# Patient Record
Sex: Male | Born: 1960 | Race: Black or African American | Hispanic: No | Smoking: Never smoker
Health system: Southern US, Community
[De-identification: ages and names within clinical notes are randomized; demographics above are authoritative.]

## PROBLEM LIST (undated history)

## (undated) DIAGNOSIS — I10 Essential (primary) hypertension: Secondary | ICD-10-CM

## (undated) DIAGNOSIS — E119 Type 2 diabetes mellitus without complications: Secondary | ICD-10-CM

---

## 2003-06-18 ENCOUNTER — Emergency Department (HOSPITAL_COMMUNITY): Admission: EM | Admit: 2003-06-18 | Discharge: 2003-06-18 | Payer: Self-pay | Admitting: Emergency Medicine

## 2009-03-09 ENCOUNTER — Inpatient Hospital Stay (HOSPITAL_COMMUNITY): Admission: EM | Admit: 2009-03-09 | Discharge: 2009-03-13 | Payer: Self-pay | Admitting: Cardiology

## 2009-03-09 ENCOUNTER — Encounter: Payer: Self-pay | Admitting: Emergency Medicine

## 2009-03-11 ENCOUNTER — Encounter (INDEPENDENT_AMBULATORY_CARE_PROVIDER_SITE_OTHER): Payer: Self-pay | Admitting: Cardiology

## 2010-07-01 IMAGING — CR DG CHEST 1V PORT
1 series · 1 of 1 positions shown · non-contrast
Comparison: None

CLINICAL DATA: Chest pain and shortness of breath

CHEST - 2 VIEW

[view not recorded]
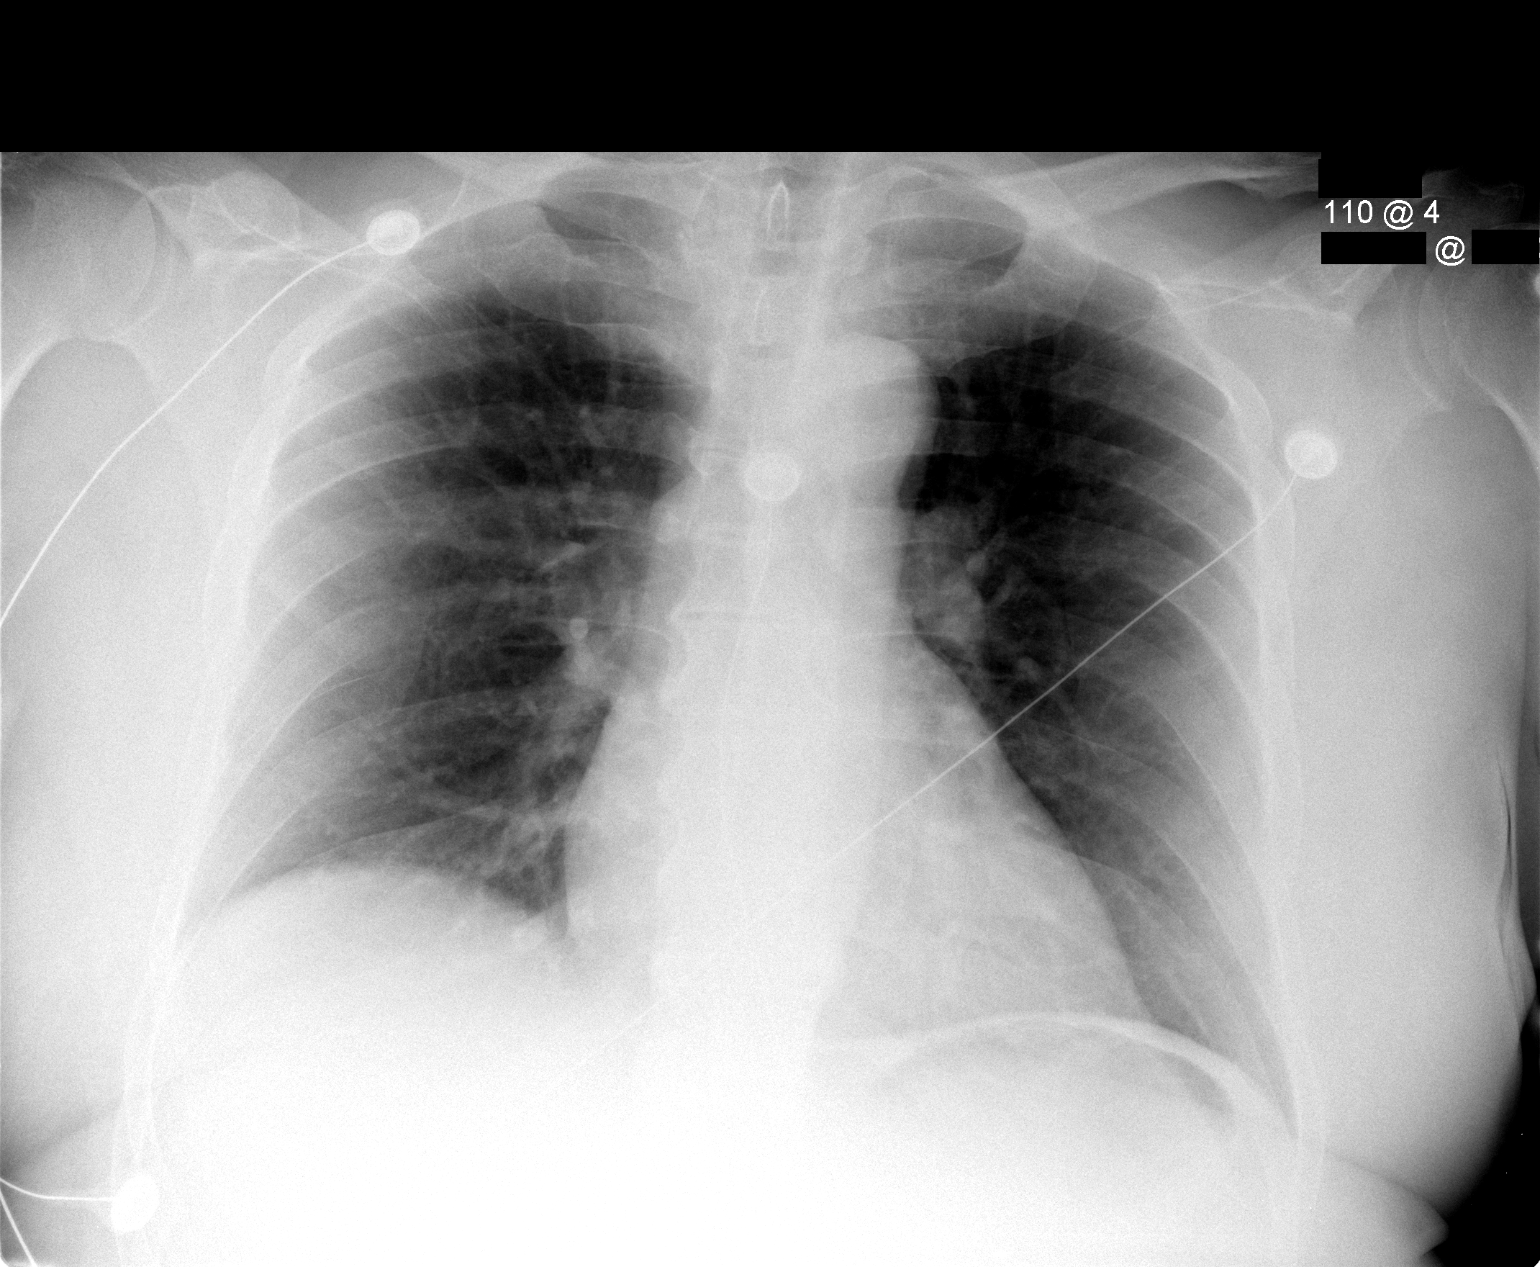

[1 of 1 positions shown; findings below may reference images not displayed]

FINDINGS: The heart size and mediastinal contours are within normal
limits.  Both lungs are clear.  The visualized skeletal structures
are unremarkable.
IMPRESSION: No active disease.

## 2010-09-04 LAB — DIFFERENTIAL
Basophils Absolute: 0.1 10*3/uL (ref 0.0–0.1)
Basophils Relative: 1 % (ref 0–1)
Eosinophils Relative: 1 % (ref 0–5)
Lymphocytes Relative: 15 % (ref 12–46)
Monocytes Absolute: 0.3 10*3/uL (ref 0.1–1.0)
Monocytes Relative: 5 % (ref 3–12)
Neutrophils Relative %: 79 % — ABNORMAL HIGH (ref 43–77)

## 2010-09-04 LAB — CBC
HCT: 42.6 % (ref 39.0–52.0)
HCT: 45.1 % (ref 39.0–52.0)
HCT: 46.4 % (ref 39.0–52.0)
HCT: 47 % (ref 39.0–52.0)
Hemoglobin: 16.2 g/dL (ref 13.0–17.0)
Hemoglobin: 13.7 g/dL (ref 13.0–17.0)
Hemoglobin: 14.5 g/dL (ref 13.0–17.0)
Hemoglobin: 14.9 g/dL (ref 13.0–17.0)
MCHC: 28.2 g/dL — ABNORMAL LOW (ref 30.0–36.0)
MCHC: 32 g/dL (ref 30.0–36.0)
MCHC: 32.3 g/dL (ref 30.0–36.0)
MCV: 77.5 fL — ABNORMAL LOW (ref 78.0–100.0)
MCV: 78.3 fL (ref 78.0–100.0)
MCV: 78.6 fL (ref 78.0–100.0)
MCV: 78.8 fL (ref 78.0–100.0)
Platelets: 262 10*3/uL (ref 150–400)
Platelets: 189 10*3/uL (ref 150–400)
Platelets: 209 10*3/uL (ref 150–400)
Platelets: 218 10*3/uL (ref 150–400)
RBC: 7.28 MIL/uL — ABNORMAL HIGH (ref 4.22–5.81)
RBC: 5.5 MIL/uL (ref 4.22–5.81)
RBC: 5.75 MIL/uL (ref 4.22–5.81)
RBC: 5.97 MIL/uL — ABNORMAL HIGH (ref 4.22–5.81)
RBC: 6.05 MIL/uL — ABNORMAL HIGH (ref 4.22–5.81)
RDW: 13.8 % (ref 11.5–15.5)
RDW: 13.8 % (ref 11.5–15.5)
RDW: 13.9 % (ref 11.5–15.5)
WBC: 7.2 10*3/uL (ref 4.0–10.5)
WBC: 10.8 10*3/uL — ABNORMAL HIGH (ref 4.0–10.5)
WBC: 7.4 10*3/uL (ref 4.0–10.5)
WBC: 7.8 10*3/uL (ref 4.0–10.5)
WBC: 8.2 10*3/uL (ref 4.0–10.5)

## 2010-09-04 LAB — HEMOGLOBIN A1C
Hgb A1c MFr Bld: 6.9 % — ABNORMAL HIGH (ref 4.6–6.1)
Mean Plasma Glucose: 151 mg/dL

## 2010-09-04 LAB — BASIC METABOLIC PANEL
BUN: 7 mg/dL (ref 6–23)
BUN: 9 mg/dL (ref 6–23)
CO2: 24 mEq/L (ref 19–32)
CO2: 27 mEq/L (ref 19–32)
Chloride: 101 mEq/L (ref 96–112)
Chloride: 101 mEq/L (ref 96–112)
Chloride: 99 mEq/L (ref 96–112)
Creatinine, Ser: 0.98 mg/dL (ref 0.4–1.5)
GFR calc non Af Amer: >60 mL/min (ref >60)
Glucose, Bld: 135 mg/dL — ABNORMAL HIGH (ref 70–99)
Glucose, Bld: 149 mg/dL — ABNORMAL HIGH (ref 70–99)
Potassium: 3.8 mEq/L (ref 3.5–5.1)
Potassium: 3.8 mEq/L (ref 3.5–5.1)
Potassium: 3.8 mEq/L (ref 3.5–5.1)
Sodium: 134 mEq/L — ABNORMAL LOW (ref 135–145)

## 2010-09-04 LAB — COMPREHENSIVE METABOLIC PANEL
ALT: 45 U/L (ref 0–53)
ALT: 61 U/L — ABNORMAL HIGH (ref 0–53)
AST: 192 U/L — ABNORMAL HIGH (ref 0–37)
Alkaline Phosphatase: 71 U/L (ref 39–117)
BUN: 5 mg/dL — ABNORMAL LOW (ref 6–23)
CO2: 30 mEq/L (ref 19–32)
Calcium: 9 mg/dL (ref 8.4–10.5)
Calcium: 9.2 mg/dL (ref 8.4–10.5)
Chloride: 96 mEq/L (ref 96–112)
GFR calc Af Amer: >60 mL/min (ref >60)
GFR calc non Af Amer: >60 mL/min (ref >60)
Glucose, Bld: 195 mg/dL — ABNORMAL HIGH (ref 70–99)
Potassium: 4.7 mEq/L (ref 3.5–5.1)
Sodium: 132 mEq/L — ABNORMAL LOW (ref 135–145)
Sodium: 135 mEq/L (ref 135–145)
Total Bilirubin: 0.3 mg/dL (ref 0.3–1.2)
Total Protein: 7.4 g/dL (ref 6.0–8.3)

## 2010-09-04 LAB — CARDIAC PANEL(CRET KIN+CKTOT+MB+TROPI)
CK, MB: 296.6 ng/mL — ABNORMAL HIGH (ref 0.3–4.0)
CK, MB: 297.2 ng/mL — ABNORMAL HIGH (ref 0.3–4.0)
Relative Index: 12.1 — ABNORMAL HIGH (ref 0.0–2.5)
Relative Index: 8.4 — ABNORMAL HIGH (ref 0.0–2.5)
Relative Index: 8.8 — ABNORMAL HIGH (ref 0.0–2.5)
Total CK: 2457 U/L — ABNORMAL HIGH (ref 7–232)
Total CK: 3376 U/L — ABNORMAL HIGH (ref 7–232)

## 2010-09-04 LAB — POCT CARDIAC MARKERS
CKMB, poc: 19.6 ng/mL (ref 1.0–8.0)
Troponin i, poc: 0.19 ng/mL — ABNORMAL HIGH (ref 0.00–0.09)

## 2010-09-04 LAB — BRAIN NATRIURETIC PEPTIDE: Pro B Natriuretic peptide (BNP): 100 pg/mL (ref 0.0–100.0)

## 2010-09-04 LAB — HEPARIN LEVEL (UNFRACTIONATED): Heparin Unfractionated: 1.39 IU/mL — ABNORMAL HIGH (ref 0.30–0.70)

## 2010-09-04 LAB — POCT I-STAT, CHEM 8
BUN: 17 mg/dL (ref 6–23)
Sodium: 137 mEq/L (ref 135–145)
TCO2: 31 mmol/L (ref 0–100)

## 2010-09-04 LAB — LIPID PANEL
HDL: 57 mg/dL (ref >39)
Total CHOL/HDL Ratio: 4.2 RATIO
Triglycerides: 29 mg/dL (ref <150)

## 2022-02-22 ENCOUNTER — Observation Stay (HOSPITAL_COMMUNITY)
Admission: EM | Admit: 2022-02-22 | Discharge: 2022-02-24 | Disposition: A | Discharge status: Home / Self Care | Payer: Commercial Managed Care - HMO | Attending: Family Medicine | Admitting: Family Medicine

## 2022-02-22 ENCOUNTER — Other Ambulatory Visit: Payer: Self-pay

## 2022-02-22 ENCOUNTER — Encounter (HOSPITAL_COMMUNITY): Payer: Self-pay | Admitting: Emergency Medicine

## 2022-02-22 ENCOUNTER — Emergency Department (HOSPITAL_COMMUNITY): Payer: Commercial Managed Care - HMO

## 2022-02-22 ENCOUNTER — Observation Stay (HOSPITAL_COMMUNITY): Payer: Commercial Managed Care - HMO

## 2022-02-22 DIAGNOSIS — R7309 Other abnormal glucose: Secondary | ICD-10-CM

## 2022-02-22 DIAGNOSIS — Z7982 Long term (current) use of aspirin: Secondary | ICD-10-CM | POA: Diagnosis not present

## 2022-02-22 DIAGNOSIS — E119 Type 2 diabetes mellitus without complications: Secondary | ICD-10-CM

## 2022-02-22 DIAGNOSIS — I10 Essential (primary) hypertension: Secondary | ICD-10-CM

## 2022-02-22 DIAGNOSIS — Z79899 Other long term (current) drug therapy: Secondary | ICD-10-CM | POA: Diagnosis not present

## 2022-02-22 DIAGNOSIS — I639 Cerebral infarction, unspecified: Principal | ICD-10-CM | POA: Diagnosis present

## 2022-02-22 DIAGNOSIS — Z789 Other specified health status: Secondary | ICD-10-CM

## 2022-02-22 DIAGNOSIS — F1090 Alcohol use, unspecified, uncomplicated: Secondary | ICD-10-CM

## 2022-02-22 DIAGNOSIS — Z7689 Persons encountering health services in other specified circumstances: Secondary | ICD-10-CM

## 2022-02-22 DIAGNOSIS — D649 Anemia, unspecified: Secondary | ICD-10-CM | POA: Insufficient documentation

## 2022-02-22 DIAGNOSIS — E876 Hypokalemia: Secondary | ICD-10-CM

## 2022-02-22 DIAGNOSIS — R29818 Other symptoms and signs involving the nervous system: Secondary | ICD-10-CM | POA: Diagnosis present

## 2022-02-22 HISTORY — DX: Essential (primary) hypertension: I10

## 2022-02-22 HISTORY — DX: Type 2 diabetes mellitus without complications: E11.9

## 2022-02-22 LAB — DIFFERENTIAL
Abs Immature Granulocytes: 0.01 10*3/uL (ref 0.00–0.07)
Basophils Absolute: 0 10*3/uL (ref 0.0–0.1)
Basophils Relative: 0 %
Eosinophils Absolute: 0 10*3/uL (ref 0.0–0.5)
Eosinophils Relative: 0 %
Immature Granulocytes: 0 %
Lymphocytes Relative: 26 %
Lymphs Abs: 1.3 10*3/uL (ref 0.7–4.0)
Monocytes Absolute: 0.4 10*3/uL (ref 0.1–1.0)
Monocytes Relative: 8 %
Neutro Abs: 3.1 10*3/uL (ref 1.7–7.7)
Neutrophils Relative %: 66 %

## 2022-02-22 LAB — CBC
HCT: 50.6 % (ref 39.0–52.0)
Hemoglobin: 15.8 g/dL (ref 13.0–17.0)
MCH: 23.7 pg — ABNORMAL LOW (ref 26.0–34.0)
MCHC: 31.2 g/dL (ref 30.0–36.0)
MCV: 76 fL — ABNORMAL LOW (ref 80.0–100.0)
Platelets: 196 10*3/uL (ref 150–400)
RBC: 6.66 MIL/uL — ABNORMAL HIGH (ref 4.22–5.81)
RDW: 14.6 % (ref 11.5–15.5)
WBC: 4.8 10*3/uL (ref 4.0–10.5)
nRBC: 0 % (ref 0.0–0.2)

## 2022-02-22 LAB — COMPREHENSIVE METABOLIC PANEL
ALT: 49 U/L — ABNORMAL HIGH (ref 0–44)
AST: 39 U/L (ref 15–41)
Albumin: 3.9 g/dL (ref 3.5–5.0)
Alkaline Phosphatase: 71 U/L (ref 38–126)
Anion gap: 11 (ref 5–15)
BUN: 14 mg/dL (ref 6–20)
CO2: 25 mmol/L (ref 22–32)
Calcium: 9.5 mg/dL (ref 8.9–10.3)
Chloride: 100 mmol/L (ref 98–111)
Creatinine, Ser: 1.22 mg/dL (ref 0.61–1.24)
GFR, Estimated: >60 mL/min (ref >60)
Glucose, Bld: 199 mg/dL — ABNORMAL HIGH (ref 70–99)
Potassium: 3.6 mmol/L (ref 3.5–5.1)
Sodium: 136 mmol/L (ref 135–145)
Total Bilirubin: 0.7 mg/dL (ref 0.3–1.2)
Total Protein: 7.2 g/dL (ref 6.5–8.1)

## 2022-02-22 LAB — I-STAT CHEM 8, ED
BUN: 18 mg/dL (ref 6–20)
Calcium, Ion: 1.21 mmol/L (ref 1.15–1.40)
Chloride: 98 mmol/L (ref 98–111)
Creatinine, Ser: 1.2 mg/dL (ref 0.61–1.24)
Glucose, Bld: 194 mg/dL — ABNORMAL HIGH (ref 70–99)
HCT: 53 % — ABNORMAL HIGH (ref 39.0–52.0)
Hemoglobin: 18 g/dL — ABNORMAL HIGH (ref 13.0–17.0)
Potassium: 3.7 mmol/L (ref 3.5–5.1)
Sodium: 137 mmol/L (ref 135–145)
TCO2: 29 mmol/L (ref 22–32)

## 2022-02-22 LAB — GLUCOSE, CAPILLARY: Glucose-Capillary: 150 mg/dL — ABNORMAL HIGH (ref 70–99)

## 2022-02-22 LAB — RAPID URINE DRUG SCREEN, HOSP PERFORMED
Amphetamines: NOT DETECTED
Barbiturates: NOT DETECTED
Benzodiazepines: NOT DETECTED
Cocaine: NOT DETECTED
Opiates: NOT DETECTED
Tetrahydrocannabinol: POSITIVE — AB

## 2022-02-22 LAB — PROTIME-INR
INR: 1.1 (ref 0.8–1.2)
Prothrombin Time: 13.6 seconds (ref 11.4–15.2)

## 2022-02-22 LAB — APTT: aPTT: 26 seconds (ref 24–36)

## 2022-02-22 LAB — HEMOGLOBIN A1C
Hgb A1c MFr Bld: 8.3 % — ABNORMAL HIGH (ref 4.8–5.6)
Mean Plasma Glucose: 191.51 mg/dL

## 2022-02-22 LAB — ETHANOL: Alcohol, Ethyl (B): <10 mg/dL (ref <10)

## 2022-02-22 MED ORDER — THIAMINE MONONITRATE 100 MG PO TABS
100.0000 mg | ORAL_TABLET | Freq: Every day | ORAL | Status: DC
  Administered 2022-02-22 – 2022-02-24 (×3): 100 mg via ORAL
  Filled 2022-02-22 (×3): qty 1

## 2022-02-22 MED ORDER — ATORVASTATIN CALCIUM 40 MG PO TABS
40.0000 mg | ORAL_TABLET | Freq: Once | ORAL | Status: AC
  Administered 2022-02-22: 40 mg via ORAL
  Filled 2022-02-22: qty 1

## 2022-02-22 MED ORDER — FOLIC ACID 1 MG PO TABS
1.0000 mg | ORAL_TABLET | Freq: Every day | ORAL | Status: DC
  Administered 2022-02-22 – 2022-02-24 (×3): 1 mg via ORAL
  Filled 2022-02-22 (×3): qty 1

## 2022-02-22 MED ORDER — ENOXAPARIN SODIUM 40 MG/0.4ML IJ SOSY
40.0000 mg | PREFILLED_SYRINGE | INTRAMUSCULAR | Status: DC
  Administered 2022-02-23 – 2022-02-24 (×2): 40 mg via SUBCUTANEOUS
  Filled 2022-02-22 (×2): qty 0.4

## 2022-02-22 MED ORDER — STROKE: EARLY STAGES OF RECOVERY BOOK
Freq: Once | Status: AC
  Filled 2022-02-22: qty 1

## 2022-02-22 MED ORDER — ASPIRIN 81 MG PO CHEW
324.0000 mg | CHEWABLE_TABLET | Freq: Once | ORAL | Status: AC
  Administered 2022-02-22: 324 mg via ORAL
  Filled 2022-02-22: qty 4

## 2022-02-22 MED ORDER — SODIUM CHLORIDE 0.9% FLUSH
3.0000 mL | Freq: Once | INTRAVENOUS | Status: AC
  Administered 2022-02-22: 3 mL via INTRAVENOUS

## 2022-02-22 MED ORDER — ATORVASTATIN CALCIUM 40 MG PO TABS
40.0000 mg | ORAL_TABLET | Freq: Every day | ORAL | Status: DC
  Administered 2022-02-23 – 2022-02-24 (×2): 40 mg via ORAL
  Filled 2022-02-22 (×2): qty 1

## 2022-02-22 MED ORDER — INSULIN ASPART 100 UNIT/ML IJ SOLN
0.0000 [IU] | Freq: Three times a day (TID) | INTRAMUSCULAR | Status: DC
  Administered 2022-02-23 (×3): 1 [IU] via SUBCUTANEOUS
  Administered 2022-02-24: 3 [IU] via SUBCUTANEOUS

## 2022-02-22 MED ORDER — IOHEXOL 350 MG/ML SOLN
75.0000 mL | Freq: Once | INTRAVENOUS | Status: AC | PRN
  Administered 2022-02-22: 75 mL via INTRAVENOUS

## 2022-02-22 NOTE — ED Provider Notes (Signed)
[facility] EMERGENCY DEPARTMENT Provider Note   CSN: [id number] Arrival date & time: 02/22/22  1501     History  Chief Complaint  Patient presents with   Stroke symptoms x 3 days    Paul Dickerson is a 61 y.o. male.  With no documented past medical history who presents to the emergency department with strokelike symptoms.  Patient states that he feels like symptoms may have began around Friday.  States that on his way to work Friday he began feeling "awkward."  He has much difficulty describing what this feels like to him.  He states that symptoms recurred on Saturday morning and then went away.  He states that then Saturday night at some time prior to dinner he began having "minor slurred speech and drooling."  He is unable to tell me what time this is.  Wife at bedside states that he made her dinner at around 10 PM after she got home from work but he tells me that symptoms began before this.  Wife states she woke up this morning and went to work and did not speak with him.  He recalls waking up with ongoing slurred speech and drooling at that time.  Wife then states that she came home from work early today and noticed that he had slurred speech and his face looked abnormal.  Patient states that he has a history of hypertension and has not taken medications in over a year.  He also does not regularly see a doctor.  He denies having any changes to his vision, numbness or tingling to the face, arms or legs or headache.  HPI     Home Medications Prior to Admission medications   Not on File      Allergies    Patient has no known allergies.    Review of Systems   Review of Systems  HENT:  Positive for drooling.   Neurological:  Positive for speech difficulty.    Physical Exam Updated Vital Signs BP (!) 222/134   Pulse 92   Temp 98.5 F (36.9 C) (Oral)   Resp 18   Ht 5\' 11"  (1.803 m)   Wt 99.8 kg   SpO2 99%   BMI 30.68 kg/m  Physical Exam Vitals and  nursing note reviewed.  Constitutional:      General: He is in acute distress.     Appearance: Normal appearance. He is not ill-appearing or toxic-appearing.  HENT:     Head: Normocephalic.     Nose: Nose normal.     Mouth/Throat:     Mouth: Mucous membranes are moist.     Pharynx: Oropharynx is clear.  Eyes:     General: No visual field deficit or scleral icterus.    Extraocular Movements: Extraocular movements intact.     Pupils: Pupils are equal, round, and reactive to light.  Cardiovascular:     Rate and Rhythm: Normal rate and regular rhythm.     Pulses: Normal pulses.  Pulmonary:     Effort: Pulmonary effort is normal. No respiratory distress.     Breath sounds: Normal breath sounds.  Abdominal:     General: Bowel sounds are normal. There is no distension.     Palpations: Abdomen is soft.     Tenderness: There is no abdominal tenderness.  Musculoskeletal:        General: Normal range of motion.     Cervical back: Neck supple.  Skin:    General: Skin is warm  and dry.     Capillary Refill: Capillary refill takes less than 2 seconds.  Neurological:     Mental Status: He is alert and oriented to person, place, and time.     Cranial Nerves: Cranial nerve deficit, dysarthria and facial asymmetry present.     Sensory: Sensation is intact.     Motor: Motor function is intact. No pronator drift.     Coordination: Coordination is intact. Finger-Nose-Finger Test and Heel to [facility] Test normal.  Psychiatric:        Mood and Affect: Mood normal.        Behavior: Behavior normal.        Judgment: Judgment normal.    ED Results / Procedures / Treatments   Labs (all labs ordered are listed, but only abnormal results are displayed) Labs Reviewed  CBC - Abnormal; Notable for the following components:      Result Value   RBC 6.66 (*)    MCV 76.0 (*)    MCH 23.7 (*)    All other components within normal limits  COMPREHENSIVE METABOLIC PANEL - Abnormal; Notable for the following  components:   Glucose, Bld 199 (*)    ALT 49 (*)    All other components within normal limits  HEMOGLOBIN A1C - Abnormal; Notable for the following components:   Hgb A1c MFr Bld 8.3 (*)    All other components within normal limits  PROTIME-INR  APTT  DIFFERENTIAL  ETHANOL  RAPID URINE DRUG SCREEN, HOSP PERFORMED  LIPID PANEL  CBC  BASIC METABOLIC PANEL  I-STAT CHEM 8, ED  CBG MONITORING, ED   EKG EKG Interpretation  Date/Time:  Sunday February 22 2022 15:20:36 EDT Ventricular Rate:  96 PR Interval:  152 QRS Duration: 150 QT Interval:  402 QTC Calculation: 507 R Axis:   -28 Text Interpretation: Normal sinus rhythm Right atrial enlargement Right bundle branch block Abnormal ECG rbbb pattern new since 2010 Confirmed by Melene Plan [id number]) on 02/22/2022 4:36:57 PM  Radiology CT HEAD WO CONTRAST  Result Date: 02/22/2022 CLINICAL DATA:  61 year old male with RIGHT facial numbness. EXAM: CT HEAD WITHOUT CONTRAST TECHNIQUE: Contiguous axial images were obtained from the base of the skull through the vertex without intravenous contrast. RADIATION DOSE REDUCTION: This exam was performed according to the departmental dose-optimization program which includes automated exposure control, adjustment of the mA and/or kV according to patient size and/or use of iterative reconstruction technique. COMPARISON:  None Available. FINDINGS: Brain: Age-indeterminate infarct versus chronic ischemic changes in the LEFT periventricular white matter noted. Atrophy, remote RIGHT basal ganglia infarct and chronic small-vessel white matter ischemic changes are noted. There is no evidence of hemorrhage, hydrocephalus, midline shift, mass lesion/mass effect or extra-axial collection. Vascular: Carotid atherosclerotic calcifications are noted. Skull: Normal. Negative for fracture or focal lesion. Sinuses/Orbits: No acute finding. The mastoid air cells, middle ears and inner ears are clear. Other: None. IMPRESSION: 1.  Age-indeterminate infarct versus chronic ischemic changes in the LEFT periventricular white matter. 2. Atrophy, remote RIGHT basal ganglia infarct and chronic small-vessel white matter ischemic changes. 3. No evidence of hemorrhage, midline shift or extra-axial collection. Electronically Signed   By: Harmon Pier M.D.   On: 02/22/2022 17:06    Procedures Procedures   Medications Ordered in ED Medications  aspirin chewable tablet 324 mg (has no administration in time range)  atorvastatin (LIPITOR) tablet 40 mg (has no administration in time range)   stroke: early stages of recovery book (has no administration in time  range)  thiamine (VITAMIN B1) tablet 100 mg (has no administration in time range)  folic acid (FOLVITE) tablet 1 mg (has no administration in time range)  enoxaparin (LOVENOX) injection 40 mg (has no administration in time range)  sodium chloride flush (NS) 0.9 % injection 3 mL (3 mLs Intravenous Given 02/22/22 1626)   ED Course/ Medical Decision Making/ A&P Clinical Course as of 02/22/22 1737  Sun Feb 22, 2022  1629 Consulted and spoke with Dr. Otelia Limes, neurology who will see the patient  [LA]  1709 CT HEAD WO CONTRAST [LA]    Clinical Course User Index [LA] Cristopher Peru, PA-C                           Medical Decision Making Amount and/or Complexity of Data Reviewed Labs: ordered. Radiology: ordered. Decision-making details documented in ED Course.  Risk Decision regarding hospitalization.  This patient presents to the ED with chief complaint(s) of facial droop with pertinent past medical history of hypertension which further complicates the presenting complaint. The complaint involves an extensive differential diagnosis and treatment options and also carries with it a high risk of complications and morbidity.    The differential diagnosis includes stroke, TIA, Bell's palsy, Todd's paralysis, etc.  Additional history obtained: Additional history obtained from  spouse Records reviewed Care Everywhere/External Records and Primary Care Documents  ED Course: Lab Tests: I Ordered, and personally interpreted labs.  The pertinent results include: Glucose 199, no AKI, electrolyte derangement, transaminitis, leukocytosis.  Coags within normal limits.  UDS, lipid panel pending Imaging Studies: I ordered and independently visualized and interpreted the following imaging CT scan head   which showed  infarct in the left periventricular white matter The interpretation of the imaging was limited to assessing for emergent pathology, for which purpose it was ordered. Cardiac Monitoring: The patient was maintained on a cardiac monitor.  I personally viewed and interpreted the cardiac monitor which showed an underlying rhythm of:  sinus rhythm Medicines ordered and prescription drug management: I ordered the following medications aspirin, Lipitor for stroke I considered this additional medications: Antihypertensives Reevaluation of the patient after these medicines showed that the patient    stayed the same  Reassessment and review: 61 year old male who presents to the emergency department with strokelike symptoms.  On physical exam he has dysarthria, right-sided facial droop, right-sided tongue deviation.  The remainder of his neurological exam with no acute findings.  His initial exam is worrisome for stroke.  He is outside of acute stroke window for tPA administration.  LVO negative.  Ordered stroke work-up including a CT of his head.  Prior to getting CT of his head and labs back I consulted and spoke with Dr. Otelia Limes, neurology who states that he will evaluate the patient.  Labs are returning with no acute findings.  His CT of his head does show a left periventricular white matter infarct.  I called and touch base with neurology and spoke with Gevena Mart, NP who requests ASA 324 and Atorvastatin 40mg . She also states to allow his hypertension for now. She will  evaluate him and requests hospital admission.  Consulted and spoke with Dr. , family medicine who agrees to admit the patient for ongoing work up. Patient made aware and is in agreement with plan.   Consultations Obtained: I requested consultation with the admitting physician Dr. Manson Passey and consultant Dr. Manson Passey and  Otelia Limes, NPwith neurology , and discussed  findings  as well as pertinent plan - they recommend: Stroke work-up, admission for stroke work-up.  Neurology requesting 324 of aspirin and 40 mg of atorvastatin which was ordered.  They placed orders for further imaging.  Dr. Owens Shark, family medicine agrees to admit the patient.  Complexity of problems addressed: Patient's presentation is most consistent with  acute presentation with potential threat to life or bodily function During patient's assessment  Disposition: After consideration of the diagnostic results and the patient's response to treatment,  I feel that the patent would benefit from admission hospital medicine .  Social Determinants of Health: Patient's impaired access to primary care  increases the complexity of managing their presentation  Final Clinical Impression(s) / ED Diagnoses Final diagnoses:  Cerebrovascular accident (CVA), unspecified mechanism [facility])    Rx / DC Orders ED Discharge Orders     None         Mickie Hillier, PA-C 02/22/22 Manson, DO 02/22/22 1905

## 2022-02-22 NOTE — ED Triage Notes (Signed)
Patient here w/ slurred speech, R sided facial droop that started at 10 p.m. yesterday. Patient states that he started feeling funny on Friday and when asked to decribe he states " I was just feeling unwell" couldn't elaborate further. Patient is noncompliant with BP medications. Aox4.

## 2022-02-22 NOTE — Assessment & Plan Note (Addendum)
TOC needs for PCP. Also needs preventative healthcare.

## 2022-02-22 NOTE — ED Notes (Signed)
Patient transported to CT 

## 2022-02-22 NOTE — Assessment & Plan Note (Addendum)
MRI with acute lacunar infarct of L corona radiata and posterior L lentiform. 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    - neurology following, appreciate assistance - start losartan 50mg  daily for uncontrolled HTN - Aspirin 81mg  daily and plavix for 3 weeks then ASA 81 monotherapy - atorvastatin 40mg  daily  -SLP evaluated, stated she could go home

## 2022-02-22 NOTE — Assessment & Plan Note (Addendum)
8.6 here, CBG q4 AC & QHS. Started on very sensitive SSI.  -plan on GLP1 outpatient

## 2022-02-22 NOTE — Hospital Course (Signed)
LIBAN GUEDES is a 61 y.o.male with a history of HTN and T2DM (untreated) who was admitted to the Teaching Service at [facility] for dysarthria and R sided facial droop. His hospital course is detailed below:  Acute cerebrovascular accident  Resulting in dysarthria and a right sided facial weakness, in setting of vertebral and intracranial stenosis. Neurology was consulted. MRI showed acute infarct in left corona radiata and posterior left lentiform nucleus as well as multiple old infarcts. 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 Moderate narrowing of right P2 and asymmetric attenuation of right MCA branch vessels. Echo with EF 45-50%. Permissive HTN was allowed for 24-48 hrs. He was started on ASA 325 and atorvastatin 40mg  for LDL 162. He was started on losartan for uncontrolled HTN. He was discharged on ASA 81 and plavix for 3 weeks then ASA monotherapy.   Type 2 DM Hgb A1c 8.3 on admission. Plan to start GLP-1 agonist outpatient. Metformin started at time of discharge.  HTN Started on losartan 50mg  given hypokalemia that required repletionduring hospitalization.  Alcohol use 15 beers in 2 days, CIWAs implemented and monitored throughout hospitalization, 0 throughout. No history of DTs or alcohol withdrawal.  Other chronic conditions were medically managed with home medications and formulary alternatives as necessary  PCP Follow-up Recommendations: [ ]  start GLP-1 for diabetes, Trulicity preferred [ ]  f/u BP  [ ]  f/u cardiology recommendations, scheduled outpatient visit

## 2022-02-22 NOTE — Assessment & Plan Note (Addendum)
Known history of HTN with med noncompliance  - Permissive HTN  - starting losartan today

## 2022-02-22 NOTE — Progress Notes (Signed)
FMTS Interim Progress Note  S:Went to bedside to check on patient alongside Dr. Sabra Heck. Patient's girlfriend, daughter and son. Denies any concerns at this time. Briefly discussed ongoing workup, all questions answered. They inquired about when he would get a bed to which I explained that although the order has been placed it could take some time but hopefully soon.   O: BP (!) 166/99   Pulse 80   Temp 98.4 F (36.9 C) (Oral)   Resp 18   Ht 5\' 11"  (1.803 m)   Wt 99.8 kg   SpO2 98%   BMI 30.68 kg/m   General: Patient sitting upright in bed, in no acute distress. Resp: normal work of breathing noted on room air  Neuro: AOx4 Psych: mood appropriate, pleasant   A/P: Patient admitted for concern of stroke after experiencing slurred speech yesterday. Neurology following, appreciate recommendations. Risk stratification labs pending along with ongoing stroke workup. Will monitor CIWAs given history of alcohol use. Hold antihypertensive treatment in the setting of likely permissive hypertension with potential stroke. Vitals stable. Telemetry and orders reviewed and appropriate. Remainder of plan per day team.   Donney Dice, DO 02/22/2022, 8:49 PM PGY-3, [facility] pager [phone]

## 2022-02-22 NOTE — Consult Note (Addendum)
Neurology Consultation  Reason for Consult: Slurred speech and right facial droop   Referring Physician: Dr. Tyrone Nine  CC: Slurred speech and facial droop  History is obtained from:patient  HPI: Paul Dickerson is a 61 y.o. male with past medical history of HTN, HLD and ETOH abuse who presents to [facility] ED for evaluation of slurred speech and facial droop. Per patient on Friday he felt "off", "not right" but denies facial droop, slurred speech or weakness. Girlfriend who is at the bedside said she did not see him but spoke to him yesterday and he sounded fine. He states Saturday he was fine and no symptoms, girlfriend states she did not see him yesterday.  He says he went to bed at 0100 and was in his normal state of health. He states that when he woke this morning he noticed slurred speech. His girlfriend states she came home from work @ 1300 and noticed his slurred speech and facial droop and brought him to the hospital. He denies any headache, vision problems, weakness, numbness or tingling  He is hypertensive 200's/100's.  He does not go to the doctor nor does he take any medication; he was supposed to be taking bp meds and cholesterol meds but has stopped those.  Her drinks ~7 beers/day, denies smoking cigarettes, endorses marijuana use, no other illicit drugs.    LKW: 0100 9/24 IV thrombolysis given?: no, outside window  Premorbid modified Rankin scale (mRS):  0-Completely asymptomatic and back to baseline post-stroke  ROS: Full ROS was performed and is negative except as noted in the HPI.    PMHx As per HPI   History reviewed. No pertinent family history.   Social History:   has no history on file for tobacco use, alcohol use, and drug use.  Medications No current facility-administered medications for this encounter. No current outpatient medications on file.   Exam: Current vital signs: BP (!) 206/131   Pulse 89   Temp 98.5 F (36.9 C) (Oral)   Resp 18   Ht '5\' 11"'   (1.803 m)   Wt 99.8 kg   SpO2 100%   BMI 30.68 kg/m  Vital signs in last 24 hours: Temp:  [98.5 F (36.9 C)-99.3 F (37.4 C)] 98.5 F (36.9 C) (09/24 1600) Pulse Rate:  [89-96] 89 (09/24 1630) Resp:  [16-18] 18 (09/24 1630) BP: (197-229)/(100-131) 206/131 (09/24 1630) SpO2:  [96 %-100 %] 100 % (09/24 1630) Weight:  [99.8 kg] 99.8 kg (09/24 1600)  GENERAL: Awake, alert in NAD HEENT: - Normocephalic and atraumatic, dry mm, missing teeth  LUNGS - Clear to auscultation bilaterally with no wheezes CV - S1S2 RRR, no m/r/g, equal pulses bilaterally. ABDOMEN - Soft, nontender, nondistended with normoactive BS Ext: warm, well perfused, intact peripheral pulses, no edema  NEURO:  Mental Status: AA&Ox3  Language: speech is dysarthric.  Naming, repetition, fluency, and comprehension intact. Cranial Nerves: PERRL EOMI, visual fields full, right facial asymmetry, facial sensation intact, hearing intact, tongue/uvula/soft palate midline, normal sternocleidomastoid and trapezius muscle strength. No evidence of tongue atrophy or fasciculations Motor: 5/5 in all 4 extremities Tone: is normal and bulk is normal Sensation- Intact to light touch bilaterally Coordination: FTN intact bilaterally, no ataxia in BLE. Gait- deferred  NIHSS 1a Level of Conscious.: 0 1b LOC Questions: 0 1c LOC Commands: 0 2 Best Gaze: 0 3 Visual: 0 4 Facial Palsy: 2 5a Motor Arm - left: 0 5b Motor Arm - Right: 0 6a Motor Leg - Left: 0 6b Motor Leg -  Right: 0 7 Limb Ataxia: 0 8 Sensory: 0 9 Best Language: 0 10 Dysarthria: 1 11 Extinct. and Inatten.: 0 TOTAL: 3   Labs I have reviewed labs in epic and the results pertinent to this consultation are:  CBC    Component Value Date/Time   WBC 4.8 02/22/2022 1624   RBC 6.66 (H) 02/22/2022 1624   HGB 15.8 02/22/2022 1624   HCT 50.6 02/22/2022 1624   PLT 196 02/22/2022 1624   MCV 76.0 (L) 02/22/2022 1624   MCH 23.7 (L) 02/22/2022 1624   MCHC 31.2 02/22/2022  1624   RDW 14.6 02/22/2022 1624   LYMPHSABS 1.3 02/22/2022 1624   MONOABS 0.4 02/22/2022 1624   EOSABS 0.0 02/22/2022 1624   BASOSABS 0.0 02/22/2022 1624    CMP     Component Value Date/Time   NA 134 (L) 03/13/2009 0415   K 3.8 03/13/2009 0415   CL 101 03/13/2009 0415   CO2 26 03/13/2009 0415   GLUCOSE 135 (H) 03/13/2009 0415   BUN 10 03/13/2009 0415   CREATININE 1.07 03/13/2009 0415   CALCIUM 8.9 03/13/2009 0415   PROT 7.4 03/10/2009 0355   ALBUMIN 3.7 03/10/2009 0355   AST 328 (H) 03/10/2009 0355   ALT 61 (H) 03/10/2009 0355   ALKPHOS 68 03/10/2009 0355   BILITOT 0.7 03/10/2009 0355   GFRNONAA >60 03/13/2009 0415   GFRAA  03/13/2009 0415    >60        The eGFR has been calculated using the MDRD equation. This calculation has not been validated in all clinical situations. eGFR's persistently <60 mL/min signify possible Chronic Kidney Disease.     Imaging I have reviewed the images obtained:  CT-head 1. Age-indeterminate infarct versus chronic ischemic changes in the LEFT periventricular white matter. 2. Atrophy, remote RIGHT basal ganglia infarct and chronic small-vessel white matter ischemic changes. 3. No evidence of hemorrhage, midline shift or extra-axial collection.   Assessment:  61 y.o. male with a past medical history of HTN, HLD and ETOH abuse who presents to the [facility] ED for evaluation of slurred speech and facial droop. Per patient on Friday he felt "off", "not right" but denies facial droop, slurred speech or weakness. LKW 0100, and awoke this am with slurred speech and facial droop.  - Exam reveals dysarthria, subtle right facial droop and positive orbiting fingers test to RUE, otherwise unremarkable - NIHSS 3 - Overall presentation is most consistent with a small acute lacunar infarction involving the left thalamus, BG or internal capsule - No prior history of stroke - Stroke risk factors: HTN, HLD and medication noncompliance   Recommendations: -  CIWA protocol  - Thiamine and folic acid daily - [id number], fasting lipid panel - MRI of the brain without contrast - Frequent neuro checks - Echocardiogram - CTA head and neck - Bedside swallow screen - Allow for permissive hypertension x 24 hours, with SBP goal < 220 and then slowly normalize  - Prophylactic therapy-Antiplatelet med: Aspirin - dose 310m - Atorvastatin 481mdaily. Obtain baseline CK level - Risk factor modification - Telemetry monitoring - PT consult, OT consult, Speech consult - Stroke team to follow   DeBeulah GandyNP, ACNPC-AG   I have seen and examined the patient. I have formulated the assessment and recommendations. [age]ear old male with a past medical history of HTN, HLD and ETOH abuse who [facility] speech and facial droop. Per patient on Friday he felt "off", "not  right" but denies facial droop, slurred speech or weakness. LKW 0100, and awoke this am with slurred speech and facial droop. Exam reveals findings referable to a left hemispheric lesion, most likely an acute lacunar infarction involving the left thalamus, BG or internal capsule. Recommendations as above.  Electronically signed: Dr. Kerney Elbe

## 2022-02-22 NOTE — H&P (Signed)
Hospital Admission History and Physical Service Pager: [phone]  Patient name: Paul Dickerson Medical record number: [id number] Date of Birth: Apr 26, 1961 Age: 61 y.o. Gender: male  Primary Care Provider: [facility], MD Consultants: Neurology  Code Status: FULL which was confirmed with family  Preferred Emergency Contact:  Contact Information     Name Relation Home Work Mobile   [place] Friend   [phone]       Chief Complaint: Slurred speech and drooping face   Assessment and Plan: Paul Dickerson is a 61 y.o. male presenting with slurred speech and drooling. Differential for this patient's presentation of this includes acute CVA seen on MR as noted below. PMHx pertinent for HTN uncontrolled.    * Stroke [facility]) MR: Age-indeterminate infarct versus chronic ischemic changes in the LEFT periventricular white matter with coinciding physical exam findings noted in PE. Neurology consulted and recommendations given. Risk strat labs pending.  - Admit to FPTS, Dr. Manson Passey attending  - Appreciate stroke team assistance  - Frequent neuro checks - Echocardiogram - CTA head and neck - allow for permissive hypertension, determine definitive timeframe with neuro  - Aspirin 325mg  - atorvastatin 40mg  daily  - PT/OT/Speech     Encounter for social work intervention TOC needs for PCP. Also needs preventative healthcare.   Elevated hemoglobin A1c 8.6 here, CBG q4 AC & QHS. Likely needs SSI.   Alcohol use 15 beers in 2 days, CIWAs implemented. No history of DTs or alcohol withdrawal.   HTN (hypertension) Known history of HTN with med noncompliance  - Permissive HTN  - Consider HCTZ when appropriate      FEN/GI: NPO> SLP to see  VTE Prophylaxis: Lovenox   Disposition: Med tele   History of Present Illness:  Paul Dickerson is a 61 y.o. male presenting with onset of slurred speech and facial droop 02/21/22 with symptoms of "feeling off' since Friday. This  has never happened before.   Per wife, patient talked to wife about slurring words and they came to the ED as she was concerned. He also had right sided facial droop with drooling.    He takes many supplements OTC including Goody powder and herbal supplements. No prescribed medications.   Patient has a history of an MI a couple of years ago but never a stroke.   H/o stroke in father.   In the ED, neurology was consulted for Code STROKE. Started on ASA 324 and lipitor 40mg . No fluids given.   Review Of Systems: Per HPI with the following additions: See above, no other ROS pertinent positive   Pertinent Past Medical History: HTN: unmedicated   Remainder reviewed in history tab.   Pertinent Past Surgical History: History reviewed. No pertinent surgical history.  Remainder reviewed in history tab.   Pertinent Social History: Tobacco use: Uses chewing tobacco  Alcohol use: beer-everyday, splits 15 pack over two days. Last beer last night. Patient has never gone into alcohol withdrawal or had seizure  Other Substance use: MJ    Pertinent Family History: History reviewed. No pertinent family history.   Remainder reviewed in history tab.   Important Outpatient Medications: None  Noncompliant with HCTZ  Remainder reviewed in medication history.   Objective: BP (!) 222/134   Pulse 92   Temp 98.5 F (36.9 C) (Oral)   Resp 18   Ht 5\' 11"  (1.803 m)   Wt 99.8 kg   SpO2 99%   BMI 30.68 kg/m  Exam: General: NAD, pleasant,  overweight male with family at bedside, slurred speech  Eyes: PERRLA, tracking well  ENTM: No obvious abnormalities  Neck: No LAD, normal rotation  Cardiovascular: RRR, no m/g/r Respiratory: CTAB without increased WOB  Gastrointestinal: Nondistended, obese, soft, NTTP  MSK: Able to move all extremities  Derm: No rashes  Neuro:  Facial droop noted on right with slurred speech  CN II: PERRL CN III, IV,VI: EOMI CV V: Normal sensation in V1, V2, V3 CN  VIII: Normal hearing CN IX,X: Symmetric palate raise  CN XI: 5/5 shoulder shrug CN XII: right ward tongue deviation  UE and LE strength 5/5 Normal sensation in UE and LE bilaterally  Psych: Normal affect and mood  Ext: trace pitting edema   Labs:  CBC BMET  Recent Labs  Lab 02/22/22 1624 02/22/22 1736  WBC 4.8  --   HGB 15.8 18.0*  HCT 50.6 53.0*  PLT 196  --    Recent Labs  Lab 02/22/22 1624 02/22/22 1736  NA 136 137  K 3.6 3.7  CL 100 98  CO2 25  --   BUN 14 18  CREATININE 1.22 1.20  GLUCOSE 199* 194*  CALCIUM 9.5  --      EKG: NSR with qt 507    Imaging Studies Performed:  CT HEAD WO CONTRAST  Result Date: 02/22/2022 CLINICAL DATA:  61 year old male with RIGHT facial numbness. EXAM: CT HEAD WITHOUT CONTRAST TECHNIQUE: Contiguous axial images were obtained from the base of the skull through the vertex without intravenous contrast. RADIATION DOSE REDUCTION: This exam was performed according to the departmental dose-optimization program which includes automated exposure control, adjustment of the mA and/or kV according to patient size and/or use of iterative reconstruction technique. COMPARISON:  None Available. FINDINGS: Brain: Age-indeterminate infarct versus chronic ischemic changes in the LEFT periventricular white matter noted. Atrophy, remote RIGHT basal ganglia infarct and chronic small-vessel white matter ischemic changes are noted. There is no evidence of hemorrhage, hydrocephalus, midline shift, mass lesion/mass effect or extra-axial collection. Vascular: Carotid atherosclerotic calcifications are noted. Skull: Normal. Negative for fracture or focal lesion. Sinuses/Orbits: No acute finding. The mastoid air cells, middle ears and inner ears are clear. Other: None. IMPRESSION: 1. Age-indeterminate infarct versus chronic ischemic changes in the LEFT periventricular white matter. 2. Atrophy, remote RIGHT basal ganglia infarct and chronic small-vessel white matter  ischemic changes. 3. No evidence of hemorrhage, midline shift or extra-axial collection. Electronically Signed   By: Margarette Canada M.D.   On: 02/22/2022 17:06     My Interpretation: CVA    Erskine Emery, MD 02/22/2022, 5:59 PM PGY-2, [place] Intern pager: [phone], text pages welcome Secure chat group [place]

## 2022-02-22 NOTE — Assessment & Plan Note (Addendum)
15 beers in 2 days, CIWAs 0 x 24 hrs. No history of DTs or alcohol withdrawal.  -started on thiamine and folic acid

## 2022-02-23 ENCOUNTER — Observation Stay (HOSPITAL_BASED_OUTPATIENT_CLINIC_OR_DEPARTMENT_OTHER): Payer: Commercial Managed Care - HMO

## 2022-02-23 ENCOUNTER — Encounter (HOSPITAL_COMMUNITY): Payer: Self-pay | Admitting: Student

## 2022-02-23 ENCOUNTER — Observation Stay (HOSPITAL_COMMUNITY): Payer: Commercial Managed Care - HMO

## 2022-02-23 DIAGNOSIS — Z789 Other specified health status: Secondary | ICD-10-CM | POA: Diagnosis not present

## 2022-02-23 DIAGNOSIS — R7309 Other abnormal glucose: Secondary | ICD-10-CM

## 2022-02-23 DIAGNOSIS — I6389 Other cerebral infarction: Secondary | ICD-10-CM | POA: Diagnosis not present

## 2022-02-23 DIAGNOSIS — I639 Cerebral infarction, unspecified: Secondary | ICD-10-CM | POA: Diagnosis not present

## 2022-02-23 DIAGNOSIS — E876 Hypokalemia: Secondary | ICD-10-CM

## 2022-02-23 LAB — ECHOCARDIOGRAM COMPLETE
Area-P 1/2: 5.13 cm2
Calc EF: 48.5 %
Height: 71 in
S' Lateral: 3.8 cm
Single Plane A2C EF: 50.1 %
Single Plane A4C EF: 47.6 %
Weight: 3520 oz

## 2022-02-23 LAB — BASIC METABOLIC PANEL
Anion gap: 8 (ref 5–15)
BUN: 10 mg/dL (ref 6–20)
CO2: 25 mmol/L (ref 22–32)
Calcium: 9.2 mg/dL (ref 8.9–10.3)
Chloride: 104 mmol/L (ref 98–111)
Creatinine, Ser: 0.86 mg/dL (ref 0.61–1.24)
GFR, Estimated: >60 mL/min (ref >60)
Glucose, Bld: 166 mg/dL — ABNORMAL HIGH (ref 70–99)
Potassium: 3.4 mmol/L — ABNORMAL LOW (ref 3.5–5.1)
Sodium: 137 mmol/L (ref 135–145)

## 2022-02-23 LAB — TSH: TSH: 1.515 u[IU]/mL (ref 0.350–4.500)

## 2022-02-23 LAB — LIPID PANEL
Cholesterol: 221 mg/dL — ABNORMAL HIGH (ref 0–200)
HDL: 48 mg/dL (ref >40)
LDL Cholesterol: 162 mg/dL — ABNORMAL HIGH (ref 0–99)
Total CHOL/HDL Ratio: 4.6 RATIO
Triglycerides: 56 mg/dL (ref <150)
VLDL: 11 mg/dL (ref 0–40)

## 2022-02-23 LAB — CBC
HCT: 46.5 % (ref 39.0–52.0)
Hemoglobin: 14.8 g/dL (ref 13.0–17.0)
MCH: 23.8 pg — ABNORMAL LOW (ref 26.0–34.0)
MCHC: 31.8 g/dL (ref 30.0–36.0)
MCV: 74.9 fL — ABNORMAL LOW (ref 80.0–100.0)
Platelets: 194 10*3/uL (ref 150–400)
RBC: 6.21 MIL/uL — ABNORMAL HIGH (ref 4.22–5.81)
RDW: 14.3 % (ref 11.5–15.5)
WBC: 4.4 10*3/uL (ref 4.0–10.5)
nRBC: 0 % (ref 0.0–0.2)

## 2022-02-23 LAB — CK: Total CK: 233 U/L (ref 49–397)

## 2022-02-23 LAB — GLUCOSE, CAPILLARY
Glucose-Capillary: 175 mg/dL — ABNORMAL HIGH (ref 70–99)
Glucose-Capillary: 196 mg/dL — ABNORMAL HIGH (ref 70–99)
Glucose-Capillary: 176 mg/dL — ABNORMAL HIGH (ref 70–99)
Glucose-Capillary: 160 mg/dL — ABNORMAL HIGH (ref 70–99)

## 2022-02-23 MED ORDER — CLOPIDOGREL BISULFATE 75 MG PO TABS
75.0000 mg | ORAL_TABLET | Freq: Every day | ORAL | Status: DC
  Administered 2022-02-24: 75 mg via ORAL
  Filled 2022-02-23: qty 1

## 2022-02-23 MED ORDER — ASPIRIN 325 MG PO TABS
325.0000 mg | ORAL_TABLET | Freq: Every day | ORAL | Status: DC
  Administered 2022-02-23 – 2022-02-24 (×2): 325 mg via ORAL
  Filled 2022-02-23 (×2): qty 1

## 2022-02-23 MED ORDER — LIVING WELL WITH DIABETES BOOK
Freq: Once | Status: DC
  Filled 2022-02-23: qty 1

## 2022-02-23 MED ORDER — POTASSIUM CHLORIDE CRYS ER 20 MEQ PO TBCR
40.0000 meq | EXTENDED_RELEASE_TABLET | Freq: Once | ORAL | Status: AC
  Administered 2022-02-23: 40 meq via ORAL
  Filled 2022-02-23: qty 2

## 2022-02-23 MED ORDER — CLOPIDOGREL BISULFATE 75 MG PO TABS: 75.0000 mg | ORAL_TABLET | Freq: Every day | ORAL | Status: DC

## 2022-02-23 MED ORDER — PERFLUTREN LIPID MICROSPHERE
1.0000 mL | INTRAVENOUS | Status: AC | PRN
  Administered 2022-02-23: 2.5 mL via INTRAVENOUS

## 2022-02-23 NOTE — TOC Initial Note (Signed)
Transition of Care [facility]) - Initial/Assessment Note    Patient Details  Name: Paul Dickerson MRN: [id number] Date of Birth: [birth year]  [facility] CM/SW Contact:    Cyndi Bender, RN Phone Number: 02/23/2022, 1:57 PM  Clinical Narrative:                  Spoke to patient and daughter at bedside. Patient is agreeable for TOC to make new PCP apt. This RNCM sent request for CMA to make apt. Patient is agreeable to do OP rehab. Patient offered choice and chose [address] Referral sent and information added to AVS. Patient has transportation to apts and home once discharged.  TOC will continue to follow for needs.   Expected Discharge Plan: OP Rehab Barriers to Discharge: Continued Medical Work up   Patient Goals and CMS Choice Patient states their goals for this hospitalization and ongoing recovery are:: return home CMS Medicare.gov Compare Post Acute Care list provided to:: Patient Choice offered to / list presented to : Patient  Expected Discharge Plan and Services Expected Discharge Plan: OP Rehab In-house Referral: PCP / [facility] Discharge Planning Services: CM Consult, Follow-up appt scheduled                                          Prior Living Arrangements/Services     Patient language and need for interpreter reviewed:: Yes Do you feel safe going back to the place where you live?: Yes      Need for Family Participation in Patient Care: Yes (Comment) Care giver support system in place?: Yes (comment)   Criminal Activity/Legal Involvement Pertinent to Current Situation/Hospitalization: No - Comment as needed  Activities of Daily Living Home Assistive Devices/Equipment: None ADL Screening (condition at time of admission) Patient's cognitive ability adequate to safely complete daily activities?: Yes Is the patient deaf or have difficulty hearing?: No Does the patient have difficulty seeing, even when wearing glasses/contacts?:  No Does the patient have difficulty concentrating, remembering, or making decisions?: No Patient able to express need for assistance with ADLs?: No Does the patient have difficulty dressing or bathing?: No Independently performs ADLs?: Yes (appropriate for developmental age) Does the patient have difficulty walking or climbing stairs?: Yes Weakness of Legs: Both Weakness of Arms/Hands: Both  Permission Sought/Granted Permission sought to share information with : Facility Arts administrator granted to share info w AGENCY: OP rehab        Emotional Assessment Appearance:: Appears stated age Attitude/Demeanor/Rapport: Engaged Affect (typically observed): Accepting Orientation: : Oriented to Self, Oriented to Place, Oriented to  Time, Oriented to Situation Alcohol / Substance Use: Not Applicable Psych Involvement: No (comment)  Admission diagnosis:  Stroke [facility]) [I63.9] Cerebrovascular accident (CVA), unspecified mechanism ([place]) [I63.9] Patient Active Problem List   Diagnosis Date Noted   Hypokalemia 02/23/2022   Stroke ([place]) 02/22/2022   HTN (hypertension) 02/22/2022   Alcohol use 02/22/2022   Elevated hemoglobin A1c 02/22/2022   Encounter for social work intervention 02/22/2022   PCP:  No primary care provider on file. Pharmacy:   [facility] [place], [place] - [address] [place] [address] Phone: [phone] Fax: [phone]     Social Determinants of Health (SDOH) Interventions    Readmission Risk Interventions  No data to display

## 2022-02-23 NOTE — Progress Notes (Signed)
Echocardiogram 2D Echocardiogram has been performed.  Paul Dickerson 02/23/2022, 12:46 PM

## 2022-02-23 NOTE — Assessment & Plan Note (Signed)
Replete K  -KClor 40 x1

## 2022-02-23 NOTE — Progress Notes (Signed)
SLP Cancellation Note  Patient Details Name: Paul Dickerson MRN: [id number] DOB: 1961/05/24   Cancelled treatment:        Attempted to see for speech-language-cognitive assessment however hospital educator in with pt and family. Will continue efforts.    [place] Siren 02/23/2022, 2:47 PM

## 2022-02-23 NOTE — Plan of Care (Signed)
  Problem: Education: Goal: Knowledge of General Education information will improve Description: Including pain rating scale, medication(s)/side effects and non-pharmacologic comfort measures Outcome: Progressing   Problem: Activity: Goal: Risk for activity intolerance will decrease Outcome: Progressing   Problem: Nutrition: Goal: Adequate nutrition will be maintained Outcome: Progressing   Problem: Coping: Goal: Level of anxiety will decrease Outcome: Progressing   Problem: Safety: Goal: Ability to remain free from injury will improve Outcome: Progressing   Problem: Education: Goal: Knowledge of disease or condition will improve Outcome: Progressing

## 2022-02-23 NOTE — Plan of Care (Signed)
  Problem: Education: Goal: Knowledge of disease or condition will improve Outcome: Progressing Goal: Knowledge of secondary prevention will improve (SELECT ALL) Outcome: Progressing   

## 2022-02-23 NOTE — Progress Notes (Addendum)
STROKE TEAM PROGRESS NOTE   INTERVAL HISTORY Patient is seen in his room with his family at the bedside.  Yesterday, he experienced acute onset slurred speech and right sided facial droop.  MRI shows acute infarct in left corona radiata and posterior left lentiform nucleus as well as multiple old infarcts. Patient does appear to be at risk for sleep apnea and is girlfriend confirms that he snores quits breathing while sleeping. Vitals:   02/23/22 0449 02/23/22 0800 02/23/22 1203 02/23/22 1620  BP: (!) 173/95 (!) 168/86 (!) 176/95 (!) 179/92  Pulse: 67 83 71 70  Resp: 17 18 17 17   Temp: 97.9 F (36.6 C) 97.9 F (36.6 C) 98.2 F (36.8 C) 98.4 F (36.9 C)  TempSrc: Oral Oral Oral Oral  SpO2: 94% 98%    Weight:      Height:       CBC:  Recent Labs  Lab 02/22/22 1624 02/22/22 1736 02/23/22 0736  WBC 4.8  --  4.4  NEUTROABS 3.1  --   --   HGB 15.8 18.0* 14.8  HCT 50.6 53.0* 46.5  MCV 76.0*  --  74.9*  PLT 196  --  [id number]   Basic Metabolic Panel:  Recent Labs  Lab 02/22/22 1624 02/22/22 1736 02/23/22 0736  NA 136 137 137  K 3.6 3.7 3.4*  CL 100 98 104  CO2 25  --  25  GLUCOSE 199* 194* 166*  BUN 14 18 10   CREATININE 1.22 1.20 0.86  CALCIUM 9.5  --  9.2   Lipid Panel:  Recent Labs  Lab 02/23/22 0736  CHOL 221*  TRIG 56  HDL 48  CHOLHDL 4.6  VLDL 11  LDLCALC 162*   HgbA1c:  Recent Labs  Lab 02/22/22 1624  HGBA1C 8.3*   Urine Drug Screen:  Recent Labs  Lab 02/22/22 1915  LABOPIA NONE DETECTED  COCAINSCRNUR NONE DETECTED  LABBENZ NONE DETECTED  AMPHETMU NONE DETECTED  THCU POSITIVE*  LABBARB NONE DETECTED    Alcohol Level  Recent Labs  Lab 02/22/22 1624  ETH <10    IMAGING past 24 hours ECHOCARDIOGRAM COMPLETE  Result Date: 02/23/2022    ECHOCARDIOGRAM REPORT   Patient Name:   BRIXTON KIRLEY Date of Exam: 02/23/2022 Medical Rec #:  [id number]         Height:       71.0 in Accession #:    [id number]        Weight:       220.0 lb Date of Birth:   [birth year]         BSA:          2.196 m Patient Age:    61 years          BP:           173/95 mmHg Patient Gender: M                 HR:           74 bpm. Exam Location:  Inpatient Procedure: 2D Echo, Cardiac Doppler, Color Doppler and Intracardiac            Opacification Agent Indications:    Stroke I63.9  History:        Patient has no prior history of Echocardiogram examinations.                 Stroke; Risk Factors:ETOH and Hypertension.  Sonographer:    Bernadene Person RDCS Referring Phys: [id number] DAN FLOYD  IMPRESSIONS  1. Left ventricular ejection fraction, by estimation, is 45 to 50%. Left ventricular ejection fraction by 2D MOD biplane is 48.5 %. The left ventricle has mildly decreased function. The left ventricle demonstrates regional wall motion abnormalities (see  scoring diagram/findings for description). There is mild left ventricular hypertrophy. Left ventricular diastolic parameters are consistent with Grade I diastolic dysfunction (impaired relaxation). There is moderate hypokinesis of the left ventricular, basal inferior wall and inferolateral wall.  2. Right ventricular systolic function is mildly reduced. The right ventricular size is normal.  3. A small pericardial effusion is present. The pericardial effusion is posterior to the left ventricle.  4. The mitral valve is abnormal. Mild mitral valve regurgitation.  5. The aortic valve is tricuspid. Aortic valve regurgitation is not visualized.  6. Aortic dilatation noted. There is borderline dilatation of the aortic root, measuring 38 mm.  7. The inferior vena cava is normal in size with greater than 50% respiratory variability, suggesting right atrial pressure of 3 mmHg. Comparison(s): No prior Echocardiogram. FINDINGS  Left Ventricle: Left ventricular ejection fraction, by estimation, is 45 to 50%. Left ventricular ejection fraction by 2D MOD biplane is 48.5 %. The left ventricle has mildly decreased function. The left ventricle demonstrates  regional wall motion abnormalities. Moderate hypokinesis of the left ventricular, basal inferior wall and inferolateral wall. Definity contrast agent was given IV to delineate the left ventricular endocardial borders. The left ventricular internal cavity size was normal in size. There is mild left ventricular hypertrophy. Left ventricular diastolic parameters are consistent with Grade I diastolic dysfunction (impaired relaxation). Indeterminate filling pressures. Right Ventricle: The right ventricular size is normal. No increase in right ventricular wall thickness. Right ventricular systolic function is mildly reduced. Left Atrium: Left atrial size was normal in size. Right Atrium: Right atrial size was normal in size. Pericardium: A small pericardial effusion is present. The pericardial effusion is posterior to the left ventricle. Mitral Valve: The mitral valve is abnormal. Mild mitral annular calcification. Mild mitral valve regurgitation. Tricuspid Valve: The tricuspid valve is grossly normal. Tricuspid valve regurgitation is trivial. Aortic Valve: The aortic valve is tricuspid. Aortic valve regurgitation is not visualized. Pulmonic Valve: The pulmonic valve was grossly normal. Pulmonic valve regurgitation is trivial. Aorta: Aortic dilatation noted. There is borderline dilatation of the aortic root, measuring 38 mm. Venous: The inferior vena cava is normal in size with greater than 50% respiratory variability, suggesting right atrial pressure of 3 mmHg. IAS/Shunts: No atrial level shunt detected by color flow Doppler.  LEFT VENTRICLE PLAX 2D                        Biplane EF (MOD) LVIDd:         4.80 cm         LV Biplane EF:   Left LVIDs:         3.80 cm                          ventricular LV PW:         1.00 cm                          ejection LV IVS:        1.30 cm                          fraction  by LVOT diam:     2.20 cm                          2D MOD LV SV:         63                                biplane is LV SV Index:   29                               48.5 %. LVOT Area:     3.80 cm                                Diastology                                LV e' medial:    4.73 cm/s LV Volumes (MOD)               LV E/e' medial:  12.9 LV vol d, MOD    61.5 ml       LV e' lateral:   6.23 cm/s A2C:                           LV E/e' lateral: 9.8 LV vol d, MOD    103.0 ml A4C: LV vol s, MOD    30.7 ml A2C: LV vol s, MOD    54.0 ml A4C: LV SV MOD A2C:   30.8 ml LV SV MOD A4C:   103.0 ml LV SV MOD BP:    40.4 ml RIGHT VENTRICLE RV S prime:     7.58 cm/s TAPSE (M-mode): 2.6 cm LEFT ATRIUM             Index        RIGHT ATRIUM           Index LA diam:        4.00 cm 1.82 cm/m   RA Area:     16.80 cm LA Vol (A2C):   66.6 ml 30.33 ml/m  RA Volume:   43.40 ml  19.77 ml/m LA Vol (A4C):   56.9 ml 25.91 ml/m LA Biplane Vol: 62.6 ml 28.51 ml/m  AORTIC VALVE LVOT Vmax:   87.90 cm/s LVOT Vmean:  54.100 cm/s LVOT VTI:    0.167 m  AORTA Ao Root diam: 3.80 cm Ao Asc diam:  3.60 cm MITRAL VALVE MV Area (PHT): 5.13 cm    SHUNTS MV Decel Time: 148 msec    Systemic VTI:  0.17 m MV E velocity: 61.00 cm/s  Systemic Diam: 2.20 cm MV A velocity: 68.30 cm/s MV E/A ratio:  0.89 Lyman Bishop MD Electronically signed by Lyman Bishop MD Signature Date/Time: 02/23/2022/1:23:54 PM    Final    MR BRAIN WO CONTRAST  Result Date: 02/23/2022 CLINICAL DATA:  61 year old male presenting with right facial numbness. Age indeterminate small vessel disease on CT. EXAM: MRI HEAD WITHOUT CONTRAST TECHNIQUE: Multiplanar, multiecho pulse sequences of the brain and surrounding structures were obtained without intravenous contrast. COMPARISON:  Head CT and CTA head and neck yesterday. FINDINGS: Brain: Curvilinear restricted diffusion along of course of about 2  cm tracking from the left corona radiata to the posterior left lentiform, corresponding to the plain CT finding. T2 and FLAIR hyperintensity. No acute hemorrhage or mass effect. No other  restricted diffusion. Chronic lacunar infarcts in the anterior right corona radiata, right basal ganglia, left thalamus, cerebellum. A few chronic microhemorrhages are scattered in both hemispheres and more numerous on the left. Widespread patchy additional bilateral white matter T2 and FLAIR hyperintensity. No cortical encephalomalacia identified. No midline shift, mass effect, evidence of mass lesion, ventriculomegaly, extra-axial collection or acute intracranial hemorrhage. Cervicomedullary junction and pituitary are within normal limits. Vascular: Major intracranial vascular flow voids are stable compared to the CTA yesterday, evidence of vertebrobasilar atherosclerosis and poor flow in the distal right vertebral artery. Skull and upper cervical spine: Negative visible cervical spine. Visualized bone marrow signal is within normal limits. Sinuses/Orbits: Negative orbits. Paranasal sinuses are stable and well aerated. Other: Mild left mastoid effusion. Other internal auditory structures appear normal. Incidental small midline nasopharyngeal Tornwaldt cyst (normal variant). Otherwise negative visible scalp and face. IMPRESSION: 1. Acute lacunar infarct of the left corona radiata and posterior left lentiform, corresponding to the plain CT finding yesterday. No acute hemorrhage or mass effect. 2. Underlying advanced chronic small vessel disease. And abnormal distal right vertebral artery is demonstrated on CTA yesterday. Electronically Signed   By: Genevie Ann M.D.   On: 02/23/2022 04:28   CT ANGIO HEAD NECK W WO CM  Result Date: 02/22/2022 CLINICAL DATA:  Right facial numbness. EXAM: CT ANGIOGRAPHY HEAD AND NECK TECHNIQUE: Multidetector CT imaging of the head and neck was performed using the standard protocol during bolus administration of intravenous contrast. Multiplanar CT image reconstructions and MIPs were obtained to evaluate the vascular anatomy. Carotid stenosis measurements (when applicable) are obtained  utilizing NASCET criteria, using the distal internal carotid diameter as the denominator. RADIATION DOSE REDUCTION: This exam was performed according to the departmental dose-optimization program which includes automated exposure control, adjustment of the mA and/or kV according to patient size and/or use of iterative reconstruction technique. CONTRAST:  60mL OMNIPAQUE IOHEXOL 350 MG/ML SOLN COMPARISON:  CT head without contrast 02/22/2022 FINDINGS: CTA NECK FINDINGS Aortic arch: Beam hardening artifact from venous contrast somewhat obscures the proximal great vessels. Atherosclerotic changes present the at distal aorta without aneurysm. No definite stenosis present. Right carotid system: Right common carotid artery demonstrates some mural calcifications. Atherosclerotic calcifications are present bifurcation and proximal right ICA without significant stenosis. The cervical right ICA is otherwise normal. Left carotid system: Left common carotid artery within normal limits. Atherosclerotic calcifications are present bifurcation and proximal left ICA. No significant stenosis is present relative to the more distal vessel. The cervical left ICA is otherwise normal. Vertebral arteries: Venous contrast obscures the proximal vertebral arteries bilaterally. A hypoplastic right vertebral artery is seen up to the C1-2 level. Distal vessel is occluded. The left vertebral artery is visualized throughout neck. 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 Vertebral body heights are maintained. No focal osseous lesions are present. Focal lucencies are present about multiple teeth. Other neck: Soft tissues the neck are otherwise unremarkable. Salivary glands are within normal limits. Thyroid is normal. No significant adenopathy is present. No focal mucosal or submucosal lesions are present. Upper chest: The lung apices are clear. Thoracic inlet is within normal limits. Review of the MIP images  confirms the above findings CTA HEAD FINDINGS Anterior circulation: Atherosclerotic calcifications present within the cavernous right internal carotid artery without a proximal stenosis.  A moderate stenosis is present at the right ICA terminus. A moderate stenosis is present in the left ICA in the paraophthalmic segment. Left ICA terminus is within normal limits. A1 and M1 segments are within normal limits bilaterally. Asymmetric attenuation of right MCA branch vessels noted without a significant proximal occlusion. Posterior circulation: A small left vertebral artery is noted. PICA origin visualized. Faint opacification of distal right V4 segment is present to the right PICA. A markedly hypoplastic basilar artery is present. Small right P1 segment is present. Mild narrowing is present in the right P2 segment. Mild narrowing is present scratched at moderate narrowing scratched at moderate stenosis is present in the inferior right P3 segment. Venous sinuses: The dural sinuses are patent. The straight sinus and deep cerebral veins are intact. Cortical veins are within normal limits. No significant vascular malformation is evident. Anatomic variants: Fetal type posterior cerebral arteries bilaterally. Review of the MIP images confirms the above findings IMPRESSION: 1. 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 2. Faint opacification of distal right V4 segment is present to the right PICA, likely representing retrograde flow. 3. 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 4. Moderate stenosis of the right ICA terminus. 5. Moderate stenosis of the left ICA in the paraophthalmic segment. 6. Mild narrowing of the right P2 segment. 7. Moderate stenosis of the inferior right P3 segment. 8. Asymmetric attenuation of right MCA branch vessels without a significant proximal occlusion. May reflect decreased perfusion secondary to the ICA terminus stenosis. 9.  Atherosclerotic calcifications within the carotid bifurcations bilaterally without significant stenosis. 10. Aortic Atherosclerosis (ICD10-I70.0). Electronically Signed   By: San Morelle M.D.   On: 02/22/2022 19:06    PHYSICAL EXAM General:  Alert, well-developed, well-nourished patient in no acute distress Respiratory:  Regular, unlabored respirations on room air  NEURO:  Mental Status: AA&Ox3  Speech/Language: speech is with significant dysarthria.  Fluency, and comprehension intact.  Cranial Nerves:  II: PERRL. Visual fields full.  III, IV, VI: EOMI. Eyelids elevate symmetrically.  V: Sensation is intact to light touch and symmetrical to face.  VII: Smile is symmetrical. VIII: hearing intact to voice. IX, X: Voice is dysarthric  [id number] shrug 5/5. XII: tongue is midline without fasciculations. Motor: 5/5 strength to all muscle groups tested.  Sensation- Intact to light touch bilaterally.  Coordination: FTN intact bilaterally, HKS: no ataxia in BLE.No drift.  Left hand orbits right  Gait- deferred  NIHSS:  1a Level of Conscious.: 0 1b LOC Questions: 0 1c LOC Commands: 0 2 Best Gaze: 0 3 Visual: 0 4 Facial Palsy: 2 5a Motor Arm - left: 0 5b Motor Arm - Right: 0 6a Motor Leg - Left: 0 6b Motor Leg - Right: 0 7 Limb Ataxia: 0 8 Sensory: 0 9 Best Language: 0 10 Dysarthria: 1 11 Extinct and Inattention.: 0 TOTAL: 3  Premorbid modified Rankin score 0  ASSESSMENT/PLAN Mr. Paul Dickerson is a 61 y.o. male with history of HTN, HLD and ETOH abuse (5 beers/day per patient) presenting with acute onset slurred speech and right sided facial droop.  MRI shows acute infarct in left corona radiata and posterior left lentiform nucleus as well as multiple old infarcts.  Stroke:  left corona radiata and lentiform nucleus stroke Etiology:  small vessel disease  CT head Age-indeterminate infarct in left periventricular white matter, remote right basal ganglia infarct  Small vessel disease. Atrophy.  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 Moderate narrowing of right P2 and asymmetric attenuation of right MCA branch vessels MRI  acute lacunar infarct in left corona radiata and posterior left lentiform nucleus 2D Echo EF 45-50%, mild LVH, grade 1 diastolic dysfunction, small pericardial effusion, mild mitral valve regurgitation, borderline dilatation of aortic root, no atrial level shunt LDL 162 HgbA1c 8.3 VTE prophylaxis - lovenox    Diet   Diet Carb Modified Fluid consistency: Thin; Room service appropriate? Yes   No antithrombotic prior to admission, now on aspirin and Plavix x 3 weeks and then aspirin alone daily.  Therapy recommendations:  outpatient PT Disposition:  pending  Hypertension Home meds:  none Stable Permissive hypertension (OK if < 220/120) but gradually normalize in 5-7 days Long-term BP goal normotensive  Hyperlipidemia Home meds:  none LDL 162, goal < 70 Add atorvastatin 40 mg daily  Continue statin at discharge  Diabetes type II Uncontrolled Home meds:  none HgbA1c 8.3, goal < 7.0 CBGs Recent Labs    02/23/22 0802 02/23/22 1205 02/23/22 1621  GLUCAP 175* 196* 176*   Diabetes coordinator consult SSI  Other Stroke Risk Factors  ETOH use, alcohol level <10, advised to drink no more than 2 drink(s) a day Substance abuse - UDS:  THC POSITIVE, Cocaine NONE DETECTED. Patient advised to stop using due to stroke risk. Obesity, Body mass index is 30.68 kg/m., BMI >/= 30 associated with increased stroke risk, recommend weight loss, diet and exercise as appropriate  Hx stroke  Other Active Problems none  Hospital day # [place] , MSN, AGACNP-BC [facility] See Amion for schedule and pager information 02/23/2022 5:37 PM   I have personally obtained history,examined this patient,  reviewed notes, independently viewed imaging studies, participated in medical decision making and plan of care.ROS completed by me personally and pertinent positives fully documented  I have made any additions or clarifications directly to the above note. Agree with note above.  Patient presented with dysarthria and right facial weakness secondary to left subcortical infarct likely from small vessel disease.  He has shown slight improvement but still has persistent deficits.  Recommend ongoing stroke work-up.  Patient counseled to quit smoking cigarettes and using marijuana and seems agreeable.  Recommend aspirin and Plavix for 3 weeks followed by aspirin alone and aggressive risk factor modification. He also appears to be at risk for sleep apnea and is interested in considering participation in the sleep smart stroke prevention study.  He was given written information to review and decide.  Discussed with patient and girlfriend and answered questions. Greater than 50% time during this 50-minute visit was spent in counseling and coordination of care about his lacunar stroke and discussion about stroke evaluation, prevention, treatment and answering questions. Antony Contras, MD Medical Director [place] Pager: [phone] 02/23/2022 6:32 PM   To contact Stroke Continuity provider, please refer to [email or web address] After hours, contact General Neurology

## 2022-02-23 NOTE — Progress Notes (Signed)
  Transition of Care [facility]) Screening Note   Patient Details  Name: Paul Dickerson Date of Birth: 05/10/1961   Transition of Care [facility]) CM/SW Contact:    Cyndi Bender, RN Phone Number: 02/23/2022, 8:44 AM    Transition of Care Department [facility]) has reviewed patient and no TOC needs have been identified at this time. We will continue to monitor patient advancement through interdisciplinary progression rounds. If new patient transition needs arise, please place a TOC consult.

## 2022-02-23 NOTE — Progress Notes (Signed)
Patient is off the floor for MRI.

## 2022-02-23 NOTE — Evaluation (Signed)
Occupational Therapy Evaluation Patient Details Name: Paul Dickerson MRN: [id number] DOB: 1961/04/09 Today's Date: 02/23/2022   History of Present Illness 61 yo male presents to [facility] on 9/24 with slurred speech, R facial droop. MRI shows Age-indeterminate infarct versus chronic ischemic changes in the LEFT periventricular white matter. PMH includes  HTN, HLD and ETOH abuse (7 beers/day).   Clinical Impression   Pt currently presents at supervision to modified independent for selfcare tasks and functional transfers.  No LOB with this therapist during mobility in the hallway with head turns or with toilet and simulated tub transfers.  Slight difficulty with donning socks secondary to ongoing flexibility issues.  Educated on AE and DME useful and beneficial for home including a shower seat and sockaide.  They will look at outside of facility for purchase.  Pt's spouse reports slightly greater right lean than baseline with pt reporting only speech deficits that he notes.  No UE impairments noted.  No further OT needs at this time, feel pt is close to functional baseline and will be OK to be alone for periods of time when his spouse [place] working.     Recommendations for follow up therapy are one component of a multi-disciplinary discharge planning process, led by the attending physician.  Recommendations may be updated based on patient status, additional functional criteria and insurance authorization.   Follow Up Recommendations  No OT follow up    Assistance Recommended at Discharge PRN  Patient can return home with the following Assist for transportation    Functional Status Assessment  Patient has not had a recent decline in their functional status  Equipment Recommendations  None recommended by OT       Precautions / Restrictions Precautions Precautions: Fall Restrictions Weight Bearing Restrictions: No      Mobility Bed Mobility                    Transfers Overall  transfer level: Modified independent   Transfers: Sit to/from Stand Sit to Stand: Modified independent (Device/Increase time)           General transfer comment: Pt needed two attempts for sit to stand from the lower bedside recliner without use of his UEs.  He also completed sit to stand from the lower toilet without UE use on one attempt.      Balance Overall balance assessment: Needs assistance Sitting-balance support: No upper extremity supported, Feet supported Sitting balance-Leahy Scale: Good     Standing balance support: No upper extremity supported, During functional activity Standing balance-Leahy Scale: Good                 High Level Balance Comments: No noted LOB during mobility without an assistive device.  Did turn quickly and fall back into his recliner when sitting down.  His spouse reports "he does that all the time."           ADL either performed or assessed with clinical judgement   ADL Overall ADL's : At baseline                                       General ADL Comments: Pt currently supervision to modified independent for selfcare tasks sit to stand.  Greater time needed for donning gripper socks secondary to limited flexibility.  He reports that this has been an issue for a while.  He was able to  ambulate up and down the hallway with head turns, pick up items off the floor, and complete simulated tub transfer without LOB.  Pt's spouse reports that he is leaning further to the right but has had problems with the right leg as he states for a while, especially the last 3 yrs.  Recommended use of a shower seat at home in the future for safety which they can purchase outside of the hospital.  Demonstrated use of a wide sockaide as well and made them aware of where it can be purchased.  He was able to return demonstrate use.     Vision Baseline Vision/History: 1 Wears glasses (reading glasses) Ability to See in Adequate Light: 0  Adequate Patient Visual Report: No change from baseline Vision Assessment?: No apparent visual deficits     Perception Perception Perception: Within Functional Limits   Praxis Praxis Praxis: Intact    Pertinent Vitals/Pain Pain Assessment Pain Assessment: Faces Pain Score: 0-No pain        Extremity/Trunk Assessment Upper Extremity Assessment Upper Extremity Assessment: Overall WFL for tasks assessed   Lower Extremity Assessment Lower Extremity Assessment: Defer to PT evaluation   Cervical / Trunk Assessment Cervical / Trunk Assessment: Normal   Communication Communication Communication: Expressive difficulties   Cognition Arousal/Alertness: Awake/alert Behavior During Therapy: WFL for tasks assessed/performed Overall Cognitive Status: Impaired/Different from baseline Area of Impairment: Awareness                         Safety/Judgement: Decreased awareness of deficits Awareness: Intellectual   General Comments: Pt's spouse reports slightly greater right lean than normal.  Pt denies any deficits except for speech.                Home Living Family/patient expects to be discharged to:: Private residence Living Arrangements: Spouse/significant other Available Help at Discharge: Family Type of Home: Apartment   Entrance Stairs-Number of Steps: 1   Home Layout: Two level;Bed/bath upstairs;Other (Comment) (sleeps on couch most of the time) Alternate Level Stairs-Number of Steps: at least 10 steps Alternate Level Stairs-Rails: Left Bathroom Shower/Tub: Teacher, early years/pre: Standard     Home Equipment: None          Prior Functioning/Environment Prior Level of Function : Independent/Modified Independent             Mobility Comments: 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           AM-PAC OT "6 Clicks" Daily Activity     Outcome Measure Help from another person eating meals?: None Help from another person  taking care of personal grooming?: None Help from another person toileting, which includes using toliet, bedpan, or urinal?: None Help from another person bathing (including washing, rinsing, drying)?: None Help from another person to put on and taking off regular upper body clothing?: None Help from another person to put on and taking off regular lower body clothing?: None 6 Click Score: 24   End of Session Equipment Utilized During Treatment: Gait belt Nurse Communication: Mobility status  Activity Tolerance: Patient tolerated treatment well Patient left: in chair;with call bell/phone within reach                   Time: 1215-1240 OT Time Calculation (min): 25 min Charges:  OT General Charges $OT Visit: 1 Visit OT Evaluation $OT Eval Low Complexity: 1 Low OT Treatments $Self Care/Home Management : 8-22 mins Seyed Heffley OTR/L 02/23/2022,  1:48 PM

## 2022-02-23 NOTE — Inpatient Diabetes Management (Signed)
Inpatient Diabetes Program Recommendations  AACE/ADA: New Consensus Statement on Inpatient Glycemic Control (2015)  Target Ranges:  Prepandial:   less than 140 mg/dL      Peak postprandial:   less than 180 mg/dL (1-2 hours)      Critically ill patients:  140 - 180 mg/dL   Lab Results  Component Value Date   GLUCAP 196 (H) 02/23/2022   HGBA1C 8.3 (H) 02/22/2022    Review of Glycemic Control  Latest Reference Range & Units 02/22/22 21:19 02/23/22 08:02 02/23/22 12:05  Glucose-Capillary 70 - 99 mg/dL 150 (H) 175 (H) 196 (H)  (H): Data is abnormally high  Diabetes history:  DM2-new diagnoses  Current orders for Inpatient glycemic control:  Novolog 0-6 units TID  Discharge recommendations:  Metformin 500 mg BID Please order a glucometer Order # [id number]  Referral received for "stroke patient with A1C of 8.3%; no home DM medications".  Spoke with patient, daughter and girlfriend at bedside. He has never been told he has diabetes; has not seen a physician in many years. Discussed A1C results (average BS of 192 mg/dL)  with them and explained what an A1C is, basic pathophysiology of DM Type 2, basic home care, basic diabetes diet nutrition principles, importance of checking CBGs and maintaining good CBG control to prevent long-term and short-term complications. Reviewed signs and symptoms of hyperglycemia and hypoglycemia and how to treat hypoglycemia at home. Also reviewed blood sugar goals at home.   RNs to provide ongoing basic DM education at bedside with this patient. Have ordered educational booklet, and DM videos.   Educated on The Plate Method, CHO's, portion control, CBGs at home fasting and mid afternoon, F/U with PCP every 3 months, bring meter to PCP office, long and short term complications of uncontrolled BG, and importance of exercise.  Per family TOC is working on making him a f/u appointment as he does not have a PCP.    Educated on Metformin, how it works, when to take  and side effects.    Will continue to follow while inpatient.  Thank you, Paul Dixon, MSN, [place] Diabetes Coordinator Inpatient Diabetes Program [phone] (team pager from 8a-5p)

## 2022-02-23 NOTE — Evaluation (Addendum)
Physical Therapy Evaluation Patient Details Name: Paul Dickerson MRN: [id number] DOB: Jun 09, 1960 Today's Date: 02/23/2022  History of Present Illness  61 yo male presents to [facility] on 9/24 with slurred speech, R facial droop. MRI shows Age-indeterminate infarct versus chronic ischemic changes in the LEFT periventricular white matter. PMH includes  HTN, HLD and ETOH abuse (7 beers/day).  Clinical Impression   Pt presents with impaired balance, R weakness and inattention, and decreased activity tolerance vs baseline. Pt to benefit from acute PT to address deficits. Pt ambulated good hallway distance without AD, but lacks insight into deficits and veers right in hallway with no attention to brushing objects with RUE. Pt states he feels close to baseline, per pt's significant other pt's balance and strength is impaired. PT reviewed s/s of CVA with BE FAST acronym, pt and girlfriend express understanding. Pt states he has family and friends to help him at d/c, girlfriend works full time. PT to progress mobility as tolerated, and will continue to follow acutely.         Recommendations for follow up therapy are one component of a multi-disciplinary discharge planning process, led by the attending physician.  Recommendations may be updated based on patient status, additional functional criteria and insurance authorization.  Follow Up Recommendations Outpatient PT      Assistance Recommended at Discharge Intermittent Supervision/Assistance  Patient can return home with the following  A little help with walking and/or transfers    Equipment Recommendations None recommended by PT  Recommendations for Other Services       Functional Status Assessment Patient has had a recent decline in their functional status and demonstrates the ability to make significant improvements in function in a reasonable and predictable amount of time.     Precautions / Restrictions Precautions Precautions:  Fall Restrictions Weight Bearing Restrictions: No      Mobility  Bed Mobility Overal bed mobility: Needs Assistance Bed Mobility: Supine to Sit     Supine to sit: Min assist, HOB elevated     General bed mobility comments: assist for trunk elevation via HHA    Transfers Overall transfer level: Needs assistance Equipment used: 1 person hand held assist Transfers: Sit to/from Stand Sit to Stand: Min assist           General transfer comment: light rise and steady assist on initial stand, supervision for second stand with incerased time to perform    Ambulation/Gait Ambulation/Gait assistance: Min guard Gait Distance (Feet): 400 Feet Assistive device: None Gait Pattern/deviations: Step-through pattern, Decreased stride length, Staggering right, Shuffle, Decreased dorsiflexion - right Gait velocity: decr     General Gait Details: cues for staying in middle of hallway as pt tends to veer R and even brushes RUE on envrionment several times with no awareness. Cues for increasing R foot clearance as able  Stairs Stairs: Yes Stairs assistance: Min guard Stair Management: One rail Right, Two rails, Alternating pattern, Step to pattern, Forwards Number of Stairs: 10 General stair comments: started with alternating pattern, changing to step to with fatigue and decreased R DF for stair clearance. Pt states this is baseline, pt's wife states it is not  Wheelchair Mobility    Modified Rankin (Stroke Patients Only) Modified Rankin (Stroke Patients Only) Pre-Morbid Rankin Score: No symptoms Modified Rankin: Moderately severe disability     Balance Overall balance assessment: Needs assistance, History of Falls (pt states falls due to R knee) Sitting-balance support: No upper extremity supported, Feet supported Sitting balance-Leahy Scale: Good  Standing balance support: No upper extremity supported, During functional activity Standing balance-Leahy Scale: Fair                High level balance activites: Direction changes, Turns, Head turns High Level Balance Comments: Min weaving of gait noted during horizontal and vertical head turns; increased time for directional changes, step over object, stair navigation             Pertinent Vitals/Pain Pain Assessment Pain Assessment: No/denies pain    Home Living Family/patient expects to be discharged to:: Private residence Living Arrangements: Spouse/significant other Available Help at Discharge: Family Type of Home: Apartment Home Access: Stairs to enter   Secretary/administrator of Steps: flight   Home Layout: One level Home Equipment: None      Prior Function Prior Level of Function : Independent/Modified Independent             Mobility Comments: 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       Hand Dominance   Dominant Hand: Left    Extremity/Trunk Assessment   Upper Extremity Assessment Upper Extremity Assessment: Defer to OT evaluation    Lower Extremity Assessment Lower Extremity Assessment: RLE deficits/detail RLE Deficits / Details: grossly 4/5, DF 3/5 with functionally decreased foot clearance during stair training    Cervical / Trunk Assessment Cervical / Trunk Assessment: Normal  Communication   Communication: Expressive difficulties  Cognition Arousal/Alertness: Awake/alert Behavior During Therapy: WFL for tasks assessed/performed Overall Cognitive Status: Impaired/Different from baseline Area of Impairment: Problem solving, Safety/judgement                         Safety/Judgement: Decreased awareness of deficits   Problem Solving: Difficulty sequencing, Requires verbal cues, Requires tactile cues General Comments: pt lacks insight into deficits, states he is no weaker than prior to CVA, states R side weakness is due to a catfish sting from many years ago. Pt's girlfriend at bedside states pt looks functionally weaker than baseline         General Comments      Exercises     Assessment/Plan    PT Assessment Patient needs continued PT services  PT Problem List Decreased strength;Decreased mobility;Decreased safety awareness;Decreased balance;Decreased knowledge of use of DME;Decreased activity tolerance       PT Treatment Interventions DME instruction;Therapeutic activities;Gait training;Therapeutic exercise;Patient/family education;Balance training;Stair training;Functional mobility training;Neuromuscular re-education    PT Goals (Current goals can be found in the Care Plan section)  Acute Rehab PT Goals PT Goal Formulation: With patient Time For Goal Achievement: 03/09/22 Potential to Achieve Goals: Good    Frequency Min 4X/week     Co-evaluation               AM-PAC PT "6 Clicks" Mobility  Outcome Measure Help needed turning from your back to your side while in a flat bed without using bedrails?: A Little Help needed moving from lying on your back to sitting on the side of a flat bed without using bedrails?: A Little Help needed moving to and from a bed to a chair (including a wheelchair)?: A Little Help needed standing up from a chair using your arms (e.g., wheelchair or bedside chair)?: A Little Help needed to walk in hospital room?: A Little Help needed climbing 3-5 steps with a railing? : A Little 6 Click Score: 18    End of Session Equipment Utilized During Treatment: Gait belt Activity Tolerance: Patient tolerated treatment well Patient  left: in chair;with call bell/phone within reach;with chair alarm set;with family/visitor present Nurse Communication: Mobility status (spoke with NT) PT Visit Diagnosis: Other abnormalities of gait and mobility (R26.89);Muscle weakness (generalized) (M62.81)    Time: 5521-7471 PT Time Calculation (min) (ACUTE ONLY): 23 min   Charges:   PT Evaluation $PT Eval Low Complexity: 1 Low PT Treatments $Gait Training: 8-22 mins      Stacie Glaze, PT  DPT Acute Rehabilitation Services Pager [phone]  Office [phone]   [place] 02/23/2022, 11:49 AM

## 2022-02-23 NOTE — Progress Notes (Addendum)
Daily Progress Note Intern Pager: [phone]  Patient name: Paul Dickerson Medical record number: [id number] Date of birth: 1961/02/12 Age: 61 y.o. Gender: male  Primary Care Provider: [facility], [facility], MD Consultants: neurology Code Status: FULL  Pt Overview and Major Events to Date:  9/24: Admit to FMTS   Assessment and Plan: Paul Dickerson is a 61 y.o. male presenting with slurred speech and drooling. Differential for this patient's presentation of this includes acute CVA seen on MR as noted below. PMHx pertinent for HTN uncontrolled.   * Stroke [facility]) MRI with acute lacunar infarct of L corona radiata and posterior L lentiform. 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    - neurology following, appreciate assistance - Frequent neuro checks - f/u echocardiogram - allow for permissive hypertension x 24 hrs, would start ARB given hypokalemia and diabetes - Aspirin 325mg  daily - atorvastatin 40mg  daily  - f/u PT/OT/Speech     Hypokalemia Replete K  -KClor 40 x1  Encounter for social work intervention TOC needs for PCP. Also needs preventative healthcare.   Elevated hemoglobin A1c 8.6 here, CBG q4 AC & QHS. Started on very sensitive SSI.   Alcohol use 15 beers in 2 days, CIWAs 0 x 24 hrs. No history of DTs or alcohol withdrawal.  -started on thiamine and folic acid  HTN (hypertension) Known history of HTN with med noncompliance  - Permissive HTN  - Consider lisinopril when appropriate    FEN/GI: carb modified  PPx: lovenox Dispo:Pending PT recommendations    Subjective:  Patient denies any pain. Reports he was sitting watching TV when he suddenly had slurred speech. Denies other weakness. Denies chest pain, abdominal pain.   Objective: Temp:  [97.5 F (36.4 C)-99.3 F (37.4 C)] 97.9 F (36.6 C) (09/25 0800) Pulse Rate:  [67-97] 83 (09/25 0800) Resp:  [14-18] 18 (09/25 0800) BP: (157-229)/(86-134) 168/86 (09/25 0800) SpO2:  [94 %-100 %]  98 % (09/25 0800) Weight:  [99.8 kg] 99.8 kg (09/24 1600)  Physical Exam: General: alert, African American male in no acute distress  Cardiovascular: RRR. No m/r/g. Respiratory: CTAB.  Abdomen: Normoactive bowel sounds. Soft, non-tender, non-distended Extremities: No peripheral edema  Neuro: alert and oriented to self, place, time. R facial droop and slurred speech. 5/5 strength in BUE and BLE. FTN intact.   Laboratory: Most recent CBC Lab Results  Component Value Date   WBC 4.4 02/23/2022   HGB 14.8 02/23/2022   HCT 46.5 02/23/2022   MCV 74.9 (L) 02/23/2022   PLT 194 02/23/2022   Most recent BMP    Latest Ref Rng & Units 02/22/2022    5:36 PM  BMP  Glucose 70 - 99 mg/dL 194   BUN 6 - 20 mg/dL 18   Creatinine 0.61 - 1.24 mg/dL 1.20   Sodium 135 - 145 mmol/L 137   Potassium 3.5 - 5.1 mmol/L 3.7   Chloride 98 - 111 mmol/L 98    Other pertinent labs  TSH wnl  CK wnl  LDL 162  UDS +THC    Imaging/Diagnostic Tests: MRI brain Radiologist Impression:  1. Acute lacunar infarct of the left corona radiata and posterior left lentiform, corresponding to the plain CT finding yesterday. No acute hemorrhage or mass effect. 2. Underlying advanced chronic small vessel disease. And abnormal distal right vertebral artery is demonstrated on CTA yesterday.  Rolanda Lundborg, MD 02/23/2022, 8:37 AM  PGY-1, [place] Intern pager: [phone], text pages welcome Secure chat group  [facility] Teaching Service

## 2022-02-24 ENCOUNTER — Other Ambulatory Visit (HOSPITAL_COMMUNITY): Payer: Self-pay

## 2022-02-24 ENCOUNTER — Telehealth (HOSPITAL_COMMUNITY): Payer: Self-pay

## 2022-02-24 DIAGNOSIS — E876 Hypokalemia: Secondary | ICD-10-CM

## 2022-02-24 DIAGNOSIS — R7309 Other abnormal glucose: Secondary | ICD-10-CM | POA: Diagnosis not present

## 2022-02-24 DIAGNOSIS — I10 Essential (primary) hypertension: Secondary | ICD-10-CM | POA: Diagnosis not present

## 2022-02-24 DIAGNOSIS — Z789 Other specified health status: Secondary | ICD-10-CM | POA: Diagnosis not present

## 2022-02-24 DIAGNOSIS — I639 Cerebral infarction, unspecified: Secondary | ICD-10-CM | POA: Diagnosis not present

## 2022-02-24 LAB — COMPREHENSIVE METABOLIC PANEL
ALT: 46 U/L — ABNORMAL HIGH (ref 0–44)
AST: 41 U/L (ref 15–41)
Albumin: 3.5 g/dL (ref 3.5–5.0)
Alkaline Phosphatase: 62 U/L (ref 38–126)
Anion gap: 9 (ref 5–15)
BUN: 8 mg/dL (ref 6–20)
CO2: 25 mmol/L (ref 22–32)
Calcium: 9.4 mg/dL (ref 8.9–10.3)
Chloride: 103 mmol/L (ref 98–111)
Creatinine, Ser: 1.08 mg/dL (ref 0.61–1.24)
GFR, Estimated: >60 mL/min (ref >60)
Glucose, Bld: 143 mg/dL — ABNORMAL HIGH (ref 70–99)
Potassium: 3.3 mmol/L — ABNORMAL LOW (ref 3.5–5.1)
Sodium: 137 mmol/L (ref 135–145)
Total Bilirubin: 0.8 mg/dL (ref 0.3–1.2)
Total Protein: 6.5 g/dL (ref 6.5–8.1)

## 2022-02-24 LAB — CBC
HCT: 46 % (ref 39.0–52.0)
Hemoglobin: 14.8 g/dL (ref 13.0–17.0)
MCH: 24.5 pg — ABNORMAL LOW (ref 26.0–34.0)
MCHC: 32.2 g/dL (ref 30.0–36.0)
MCV: 76 fL — ABNORMAL LOW (ref 80.0–100.0)
Platelets: 188 10*3/uL (ref 150–400)
RBC: 6.05 MIL/uL — ABNORMAL HIGH (ref 4.22–5.81)
RDW: 14.2 % (ref 11.5–15.5)
WBC: 4.2 10*3/uL (ref 4.0–10.5)
nRBC: 0 % (ref 0.0–0.2)

## 2022-02-24 LAB — GLUCOSE, CAPILLARY
Glucose-Capillary: 154 mg/dL — ABNORMAL HIGH (ref 70–99)
Glucose-Capillary: 291 mg/dL — ABNORMAL HIGH (ref 70–99)

## 2022-02-24 MED ORDER — POTASSIUM CHLORIDE CRYS ER 20 MEQ PO TBCR
40.0000 meq | EXTENDED_RELEASE_TABLET | Freq: Once | ORAL | Status: AC
  Administered 2022-02-24: 40 meq via ORAL
  Filled 2022-02-24: qty 2

## 2022-02-24 MED ORDER — ASPIRIN 81 MG PO TBEC
81.0000 mg | DELAYED_RELEASE_TABLET | Freq: Every day | ORAL | 2 refills | Status: DC
  Filled 2022-02-24: qty 30, 30d supply, fill #0
  Filled 2022-03-26: qty 90, 90d supply, fill #0
  Filled 2022-07-09: qty 30, 30d supply, fill #1
  Filled 2022-08-18 (×2): qty 30, 30d supply, fill #2
  Filled 2022-09-12: qty 30, 30d supply, fill #3
  Filled 2022-10-08: qty 30, 30d supply, fill #4
  Filled 2022-11-11: qty 30, 30d supply, fill #5
  Filled 2022-12-09: qty 30, 30d supply, fill #6
  Filled 2023-01-06: qty 30, 30d supply, fill #7
  Filled 2023-02-03: qty 30, 30d supply, fill #8

## 2022-02-24 MED ORDER — ATORVASTATIN CALCIUM 40 MG PO TABS
40.0000 mg | ORAL_TABLET | Freq: Every day | ORAL | 1 refills | Status: DC
  Filled 2022-02-24 – 2022-03-26 (×2): qty 30, 30d supply, fill #0

## 2022-02-24 MED ORDER — METFORMIN HCL ER 500 MG PO TB24
500.0000 mg | ORAL_TABLET | Freq: Every day | ORAL | 0 refills | Status: DC
  Filled 2022-02-24: qty 30, 30d supply, fill #0

## 2022-02-24 MED ORDER — CLOPIDOGREL BISULFATE 75 MG PO TABS
75.0000 mg | ORAL_TABLET | Freq: Every day | ORAL | 0 refills | Status: AC
  Filled 2022-02-24: qty 20, 20d supply, fill #0

## 2022-02-24 MED ORDER — LOSARTAN POTASSIUM 50 MG PO TABS
50.0000 mg | ORAL_TABLET | Freq: Every day | ORAL | 1 refills | Status: DC
  Filled 2022-02-24: qty 30, 30d supply, fill #0

## 2022-02-24 MED ORDER — THIAMINE HCL 100 MG PO TABS
100.0000 mg | ORAL_TABLET | Freq: Every day | ORAL | 1 refills | Status: AC
  Filled 2022-02-24: qty 30, 30d supply, fill #0
  Filled 2022-03-26: qty 30, 30d supply, fill #1
  Filled 2022-03-26: qty 30, 30d supply, fill #0

## 2022-02-24 MED ORDER — FOLIC ACID 1 MG PO TABS
1.0000 mg | ORAL_TABLET | Freq: Every day | ORAL | 1 refills | Status: DC
  Filled 2022-02-24 – 2022-03-26 (×2): qty 30, 30d supply, fill #0

## 2022-02-24 NOTE — Evaluation (Addendum)
Speech Language Pathology Evaluation Patient Details Name: Paul Dickerson MRN: [id number] DOB: June 30, 1960 Today's Date: 02/24/2022 Time: 5852-7782 SLP Time Calculation (min) (ACUTE ONLY): 16 min  Problem List:  Patient Active Problem List   Diagnosis Date Noted   Hypokalemia 02/23/2022   Stroke (HCC) 02/22/2022   HTN (hypertension) 02/22/2022   Alcohol use 02/22/2022   Elevated hemoglobin A1c 02/22/2022   Encounter for social work intervention 02/22/2022   Past Medical History:  Past Medical History:  Diagnosis Date   Hypertension    Type 2 diabetes mellitus (HCC)    Past Surgical History: History reviewed. No pertinent surgical history. HPI:  61 yo male [facility]/24 with slurred speech, R facial droop. MRI shows Age-indeterminate infarct versus chronic ischemic changes in the LEFT periventricular white matter. PMH includes  HTN, HLD and ETOH abuse (7 beers/day).   Assessment / Plan / Recommendation Clinical Impression  Pt demonstrates moderate oromotor weakness, decreased sensation, dysarthria and cognitive impairments. Right labial/linugal imprecision present leading to significant dysarthria in phrases marked by increased rate and distortion. His vocal quality is wet and pt states "I have a lot of saliva" and denies difficulty swallowing. He achieved a score of 22/30 on SLUMS revealing a mild neurocognitive disorder. Language abilities are intact. SLP introduced strategies to improve speech intelligibility with examples. SLP recommends continued ST on acute care and at next venue of care (inpatient rehab?) moving towards achievement of goals and independence.    SLP Assessment  SLP Recommendation/Assessment: Patient needs continued Speech Lanaguage Pathology Services SLP Visit Diagnosis: Dysarthria and anarthria (R47.1);Cognitive communication deficit (R41.841)    Recommendations for follow up therapy are one component of a multi-disciplinary discharge planning  process, led by the attending physician.  Recommendations may be updated based on patient status, additional functional criteria and insurance authorization.    Follow Up Recommendations  Acute inpatient rehab (3hours/day)    Assistance Recommended at Discharge  Frequent or constant Supervision/Assistance  Functional Status Assessment Patient has had a recent decline in their functional status and demonstrates the ability to make significant improvements in function in a reasonable and predictable amount of time.  Frequency and Duration min 2x/week  2 weeks      SLP Evaluation Cognition  Overall Cognitive Status: Impaired/Different from baseline Arousal/Alertness: Awake/alert Orientation Level: Oriented to person;Oriented to time Year: 2023 Day of Week: Correct Attention: Sustained Sustained Attention: Appears intact Memory: Impaired Memory Impairment: Retrieval deficit (4/5 correct independently) Awareness: Appears intact Problem Solving: Impaired Problem Solving Impairment: Functional basic Safety/Judgment:  (questionable)       Comprehension  Auditory Comprehension Overall Auditory Comprehension: Appears within functional limits for tasks assessed Visual Recognition/Discrimination Discrimination: Not tested Reading Comprehension Reading Status: Not tested    Expression Expression Primary Mode of Expression: Verbal Verbal Expression Overall Verbal Expression: Appears within functional limits for tasks assessed Naming: No impairment Pragmatics: No impairment Written Expression Written Expression: Not tested   Oral / Motor  Oral Motor/Sensory Function Overall Oral Motor/Sensory Function: Moderate impairment Facial ROM: Reduced right;Suspected CN VII (facial) dysfunction Facial Symmetry: Abnormal symmetry right;Suspected CN VII (facial) dysfunction Facial Strength: Reduced right;Suspected CN VII (facial) dysfunction Facial Sensation: Reduced right;Suspected CN V  (Trigeminal) dysfunction Lingual ROM: Reduced right;Suspected CN XII (hypoglossal) dysfunction Mandible: Within Functional Limits Motor Speech Overall Motor Speech: Impaired Respiration: Within functional limits Phonation: Wet Resonance: Within functional limits Articulation: Impaired Level of Impairment: Word Intelligibility: Intelligibility reduced Word: 75-100% accurate Phrase: 50-74% accurate Sentence: 50-74% accurate Motor Planning: Witnin  functional limits            Houston Siren 02/24/2022, 11:35 AM

## 2022-02-24 NOTE — Discharge Summary (Addendum)
[facility] Discharge Summary  Patient name: Paul Dickerson Medical record number: [id number] Date of birth: 1961/02/25 Age: 61 y.o. Gender: male Date of Admission: 02/22/2022  Date of Discharge: 02/24/2022 Admitting Physician: Erskine Emery, MD  Primary Care Provider: No primary care provider on file. Consultants: neurology  Indication for Hospitalization: stroke  Brief Hospital Course:  Paul Dickerson is a 60 y.o.male with a history of HTN and T2DM (untreated) who was admitted to the Teaching Service at [facility] for dysarthria and R sided facial droop. His hospital course is detailed below:  Acute cerebrovascular accident  Resulting in dysarthria and a right sided facial weakness, in setting of vertebral and intracranial stenosis. Neurology was consulted. MRI showed acute infarct in left corona radiata and posterior left lentiform nucleus as well as multiple old infarcts. 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 Moderate narrowing of right P2 and asymmetric attenuation of right MCA branch vessels. Echo with EF 45-50%. Permissive HTN was allowed for 24-48 hrs. He was started on ASA 325 and atorvastatin 40mg  for LDL 162. He was started on losartan for uncontrolled HTN. He was discharged on ASA 81 and plavix for 3 weeks then ASA monotherapy.   Type 2 DM Hgb A1c 8.3 on admission. Plan to start GLP-1 agonist outpatient. Metformin started at time of discharge.  HTN Started on losartan 50mg  given hypokalemia that required repletionduring hospitalization.  Alcohol use 15 beers in 2 days, CIWAs implemented and monitored throughout hospitalization, 0 throughout. No history of DTs or alcohol withdrawal.  Other chronic conditions were medically managed with home medications and formulary alternatives as necessary  PCP Follow-up Recommendations: [ ]  start  GLP-1 for diabetes, Trulicity preferred [ ]  f/u BP  [ ]  f/u cardiology recommendations, scheduled outpatient visit   Discharge Diagnoses/Problem List:  Principal Problem:   Stroke [facility]) Active Problems:   HTN (hypertension)   Alcohol use   Elevated hemoglobin A1c   Encounter for social work intervention   Hypokalemia   Disposition: Home with home health PT  Discharge Condition: Stable  Discharge Exam:  General: alert, well-appearing African American male in no acute distress Cardiovascular: RRR. No m/r/g. Respiratory: CTAB. Normal WOB on RA.  Abdomen: Soft, non-tender, non-distended Extremities: Trace peripheral edema  Neuro: R facial droop and dysarthria. 5/5 strength in BUE and BLE.   Significant Procedures: none  Significant Labs and Imaging:  Recent Labs  Lab 02/22/22 1624 02/22/22 1736 02/23/22 0736 02/24/22 0338  WBC 4.8  --  4.4 4.2  HGB 15.8 18.0* 14.8 14.8  HCT 50.6 53.0* 46.5 46.0  PLT 196  --  194 188   Recent Labs  Lab 02/22/22 1624 02/22/22 1736 02/23/22 0736 02/24/22 0338  NA 136 137 137 137  K 3.6 3.7 3.4* 3.3*  CL 100 98 104 103  CO2 25  --  25 25  GLUCOSE 199* 194* 166* 143*  BUN 14 18 10 8   CREATININE 1.22 1.20 0.86 1.08  CALCIUM 9.5  --  9.2 9.4  ALKPHOS 71  --   --  62  AST 39  --   --  41  ALT 49*  --   --  46*  ALBUMIN 3.9  --   --  3.5   CT head Age-indeterminate infarct in left periventricular white matter, remote right basal ganglia infarct Small vessel disease. Atrophy.  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 Moderate narrowing of right P2 and asymmetric attenuation of right MCA branch vessels MRI  acute lacunar infarct in left corona radiata and posterior left lentiform nucleus 2D Echo EF 45-50%, mild LVH, grade 1 diastolic dysfunction, small pericardial effusion, mild mitral valve regurgitation, borderline dilatation of  aortic root, no atrial level shunt. There is moderate hypokinesis of the left ventricular, basal inferior wall and inferolateral wall.  LDL 162 HgbA1c 8.3  Results/Tests Pending at Time of Discharge: none  Discharge Medications:  Allergies as of 02/24/2022   No Known Allergies      Medication List     TAKE these medications    Aspirin Low Dose 81 MG tablet Generic drug: aspirin EC Take 1 tablet (81 mg total) by mouth daily. Swallow whole.   atorvastatin 40 MG tablet Commonly known as: LIPITOR Take 1 tablet (40 mg total) by mouth daily.   clopidogrel 75 MG tablet Commonly known as: PLAVIX Take 1 tablet (75 mg total) by mouth daily for 20 days.   folic acid 1 MG tablet Commonly known as: FOLVITE Take 1 tablet (1 mg total) by mouth daily.   losartan 50 MG tablet Commonly known as: Cozaar Take 1 tablet (50 mg total) by mouth daily.   metFORMIN 500 MG 24 hr tablet Commonly known as: GLUCOPHAGE-XR Take 1 tablet (500 mg total) by mouth daily with breakfast.   thiamine 100 MG tablet Commonly known as: VITAMIN B1 Take 1 tablet (100 mg total) by mouth daily.        Discharge Instructions: Please refer to Patient Instructions section of EMR for full details.  Patient was counseled important signs and symptoms that should prompt return to medical care, changes in medications, dietary instructions, activity restrictions, and follow up appointments.   Follow-Up Appointments:  Follow-up Information     [facility] Schedule an appointment as soon as possible for a visit.   Specialty: Rehabilitation Contact information: [address] [id number] [place] [address] [place] [address] [phone]        Alfredo Martinez, MD. Go on 03/12/2022.   Specialty: Family Medicine Why: @2 :30 Contact information: [address] [place] [id number]         [phone], MD Follow up.   Specialties:  Cardiology, Radiology Contact information: [address] [place] [phone]                 [phone], MD 02/24/2022, 12:15 PM PGY-1, [facility]    Upper Level Addendum:  I have seen and evaluated this patient along with Dr. 02/26/2022 and reviewed the above note, making necessary revisions as appropriate.  I agree with the medical decision making and physical exam as noted above.  Standley Brooking, MD PGY-2 [facility] Family [facility] Res[facility]   I will see patient as PCP on October 12th in [facility]C. Needs cardiology follow up with echo results and lack of care prior to this hospitalization, asymptomatic. Likely to start GLP1 but will discuss, metformin on discharge  Neuro recs appreciated

## 2022-02-24 NOTE — Discharge Instructions (Addendum)
Dear Paul Dickerson,   Thank you so much for allowing [place] to be part of your care!  You were admitted to [facility] for a stroke. We treated you with Aspirin long term and plavix. The plavix you will stop the 03/16/22.  Please continue with Losartan for blood pressure and your statin (for cholesterol) to help prevent strokes.   Your heart ultrasound showed some abnormalities and we are going to need you to follow-up with cardiology outpatient.   We will see you in clinic on the 12th of next month.  Please call the clinic [phone] if you need anything and I will call you back.   POST-HOSPITAL & CARE INSTRUCTIONS Follow up with PCP as noted below Follow-up with cardiology Dr. Gardiner Rhyme, they will schedule an appointment Please let PCP/Specialists know of any changes that were made.  Please see medications section of this packet for any medication changes.   DOCTOR'S APPOINTMENT & FOLLOW UP CARE INSTRUCTIONS  Future Appointments  Date Time Provider [place]  03/12/2022  2:30 PM Erskine Emery, MD [facility] [place]    RETURN PRECAUTIONS: - shortness of breath  - chest pain  - swelling in the legs  - worsening confusion or fever/chills   Take care and be well!  [facility]  [place], [place] [address] [phone]

## 2022-02-24 NOTE — TOC CAGE-AID Note (Signed)
Transition of Care [facility]) - CAGE-AID Screening   Patient Details  Name: Paul Dickerson MRN: [id number] Date of Birth: 1960-07-07  Transition of Care [facility]) CM/SW Contact:    Benard Halsted, LCSW Phone Number: 02/24/2022, 12:13 PM   Clinical Narrative: Patient reported that he drinks alcohol (beer) occasionally but that it does not make him drunk and he does not feel it is an issue for him and does not needs resources at this time. He stated he has tobacco use but does not want resources for that either. He reported he hopes his speech improves soon.    CAGE-AID Screening:    Have You Ever Felt You Ought to Cut Down on Your Drinking or Drug Use?: No Have People Annoyed You By Critizing Your Drinking Or Drug Use?: No Have You Felt Bad Or Guilty About Your Drinking Or Drug Use?: No Have You Ever Had a Drink or Used Drugs First Thing In The Morning to Steady Your Nerves or to Get Rid of a Hangover?: No CAGE-AID Score: 0  Substance Abuse Education Offered: No (Patient refused resources)

## 2022-02-24 NOTE — Progress Notes (Signed)
STROKE TEAM PROGRESS NOTE   INTERVAL HISTORY Patient is seen in his room with no  family at the bedside.  He continues to have dysarthria and right facial weakness which is unchanged.  He did sign consent and participated in the sleep smart stroke prevention study and tested positive for sleep apnea on the overnight NOx 3 monitor. Vitals:   02/23/22 2000 02/23/22 2338 02/24/22 0458 02/24/22 0800  BP: (!) 198/105 (!) 158/91 (!) 177/100   Pulse: 77 71 67   Resp: 17 16 19 18   Temp: 97.9 F (36.6 C) 98.1 F (36.7 C) 98 F (36.7 C) 98.2 F (36.8 C)  TempSrc: Oral Oral Oral Oral  SpO2: 99% 96% 96%   Weight:      Height:       CBC:  Recent Labs  Lab 02/22/22 1624 02/22/22 1736 02/23/22 0736 02/24/22 0338  WBC 4.8  --  4.4 4.2  NEUTROABS 3.1  --   --   --   HGB 15.8   < > 14.8 14.8  HCT 50.6   < > 46.5 46.0  MCV 76.0*  --  74.9* 76.0*  PLT 196  --  194 188   < > = values in this interval not displayed.   Basic Metabolic Panel:  Recent Labs  Lab 02/23/22 0736 02/24/22 0338  NA 137 137  K 3.4* 3.3*  CL 104 103  CO2 25 25  GLUCOSE 166* 143*  BUN 10 8  CREATININE 0.86 1.08  CALCIUM 9.2 9.4   Lipid Panel:  Recent Labs  Lab 02/23/22 0736  CHOL 221*  TRIG 56  HDL 48  CHOLHDL 4.6  VLDL 11  LDLCALC 162*   HgbA1c:  Recent Labs  Lab 02/22/22 1624  HGBA1C 8.3*   Urine Drug Screen:  Recent Labs  Lab 02/22/22 1915  LABOPIA NONE DETECTED  COCAINSCRNUR NONE DETECTED  LABBENZ NONE DETECTED  AMPHETMU NONE DETECTED  THCU POSITIVE*  LABBARB NONE DETECTED    Alcohol Level  Recent Labs  Lab 02/22/22 1624  ETH <10    IMAGING past 24 hours No results found.  PHYSICAL EXAM General:  Alert, well-developed, well-nourished middle-age African-American male in no acute distress Respiratory:  Regular, unlabored respirations on room air  NEURO:  Mental Status: AA&Ox3  Speech/Language: speech is with significant dysarthria.  Fluency, and comprehension  intact.  Cranial Nerves:  II: PERRL. Visual fields full.  III, IV, VI: EOMI. Eyelids elevate symmetrically.  V: Sensation is intact to light touch and symmetrical to face.  VII: Smile is symmetrical. VIII: hearing intact to voice. IX, X: Voice is dysarthric  ST:[id number] shrug 5/5. XII: tongue is midline without fasciculations. Motor: 5/5 strength to all muscle groups tested.  Sensation- Intact to light touch bilaterally.  Coordination: FTN intact bilaterally, HKS: no ataxia in BLE.No drift.  Left hand orbits right  Gait- deferred  NIHSS:  1a Level of Conscious.: 0 1b LOC Questions: 0 1c LOC Commands: 0 2 Best Gaze: 0 3 Visual: 0 4 Facial Palsy: 2 5a Motor Arm - left: 0 5b Motor Arm - Right: 0 6a Motor Leg - Left: 0 6b Motor Leg - Right: 0 7 Limb Ataxia: 0 8 Sensory: 0 9 Best Language: 0 10 Dysarthria: 1 11 Extinct and Inattention.: 0 TOTAL: 3  Premorbid modified Rankin score 0  ASSESSMENT/PLAN Paul Dickerson is a 61 y.o. male with history of HTN, HLD and ETOH abuse (5 beers/day per patient) presenting with acute onset slurred speech  and right sided facial droop.  MRI shows acute infarct in left corona radiata and posterior left lentiform nucleus as well as multiple old infarcts.  Stroke:  left corona radiata and lentiform nucleus stroke Etiology:  small vessel disease  CT head Age-indeterminate infarct in left periventricular white matter, remote right basal ganglia infarct Small vessel disease. Atrophy.  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 Moderate narrowing of right P2 and asymmetric attenuation of right MCA branch vessels MRI  acute lacunar infarct in left corona radiata and posterior left lentiform nucleus 2D Echo EF 45-50%, mild LVH, grade 1 diastolic dysfunction, small pericardial effusion, mild mitral valve regurgitation, borderline dilatation of  aortic root, no atrial level shunt LDL 162 HgbA1c 8.3 VTE prophylaxis - lovenox    There are no active orders of the following types: Diet, Nourishments.   No antithrombotic prior to admission, now on aspirin and Plavix x 3 weeks and then aspirin alone daily.  Therapy recommendations:  outpatient PT Disposition:  pending  Hypertension Home meds:  none Stable Permissive hypertension (OK if < 220/120) but gradually normalize in 5-7 days Long-term BP goal normotensive  Hyperlipidemia Home meds:  none LDL 162, goal < 70 Add atorvastatin 40 mg daily  Continue statin at discharge  Diabetes type II Uncontrolled Home meds:  none HgbA1c 8.3, goal < 7.0 CBGs Recent Labs    02/23/22 2122 02/24/22 0736 02/24/22 1138  GLUCAP 160* 154* 291*   Diabetes coordinator consult SSI  Other Stroke Risk Factors  ETOH use, alcohol level <10, advised to drink no more than 2 drink(s) a day Substance abuse - UDS:  THC POSITIVE, Cocaine NONE DETECTED. Patient advised to stop using due to stroke risk. Obesity, Body mass index is 30.68 kg/m., BMI >/= 30 associated with increased stroke risk, recommend weight loss, diet and exercise as appropriate  Hx stroke  Other Active Problems none  Hospital day # 0  Patient presented with dysarthria and right facial weakness secondary to left subcortical infarct likely from small vessel disease.  He has shown slight improvement but still has persistent deficits.  Patient counseled to quit smoking cigarettes and using marijuana and seems agreeable.  Recommend aspirin and Plavix for 3 weeks followed by aspirin alone and aggressive risk factor modification. Patient signed consent for participation in the sleep smart stroke prevention study.  Marland Kitchen  He tested positive on the overnight NOx 3 monitor and will need to do CPAP titration trial tonight. Greater than 50% time during this visit was spent in counseling and coordination of care about his lacunar stroke  and discussion about stroke evaluation, prevention, treatment and answering questions. Delia Heady, MD Medical Director [facility] Pager: [phone] 02/24/2022 10:36 PM   To contact Stroke Continuity provider, please refer to [email or web address]. After hours, contact General Neurology

## 2022-02-24 NOTE — Plan of Care (Signed)
Problem: Education: Goal: Ability to describe self-care measures that may prevent or decrease complications (Diabetes Survival Skills Education) will improve Outcome: Adequate for Discharge Goal: Individualized Educational Video(s) Outcome: Adequate for Discharge   Problem: Coping: Goal: Ability to adjust to condition or change in health will improve Outcome: Adequate for Discharge   Problem: Fluid Volume: Goal: Ability to maintain a balanced intake and output will improve Outcome: Adequate for Discharge   Problem: Health Behavior/Discharge Planning: Goal: Ability to identify and utilize available resources and services will improve Outcome: Adequate for Discharge Goal: Ability to manage health-related needs will improve Outcome: Adequate for Discharge   Problem: Metabolic: Goal: Ability to maintain appropriate glucose levels will improve Outcome: Adequate for Discharge   Problem: Nutritional: Goal: Maintenance of adequate nutrition will improve Outcome: Adequate for Discharge Goal: Progress toward achieving an optimal weight will improve Outcome: Adequate for Discharge   Problem: Skin Integrity: Goal: Risk for impaired skin integrity will decrease Outcome: Adequate for Discharge   Problem: Tissue Perfusion: Goal: Adequacy of tissue perfusion will improve Outcome: Adequate for Discharge   Problem: Education: Goal: Knowledge of General Education information will improve Description: Including pain rating scale, medication(s)/side effects and non-pharmacologic comfort measures Outcome: Adequate for Discharge   Problem: Health Behavior/Discharge Planning: Goal: Ability to manage health-related needs will improve Outcome: Adequate for Discharge   Problem: Clinical Measurements: Goal: Ability to maintain clinical measurements within normal limits will improve Outcome: Adequate for Discharge Goal: Will remain free from infection Outcome: Adequate for Discharge Goal:  Diagnostic test results will improve Outcome: Adequate for Discharge Goal: Respiratory complications will improve Outcome: Adequate for Discharge Goal: Cardiovascular complication will be avoided Outcome: Adequate for Discharge   Problem: Activity: Goal: Risk for activity intolerance will decrease Outcome: Adequate for Discharge   Problem: Nutrition: Goal: Adequate nutrition will be maintained Outcome: Adequate for Discharge   Problem: Coping: Goal: Level of anxiety will decrease Outcome: Adequate for Discharge   Problem: Elimination: Goal: Will not experience complications related to bowel motility Outcome: Adequate for Discharge Goal: Will not experience complications related to urinary retention Outcome: Adequate for Discharge   Problem: Pain Managment: Goal: General experience of comfort will improve Outcome: Adequate for Discharge   Problem: Safety: Goal: Ability to remain free from injury will improve Outcome: Adequate for Discharge   Problem: Skin Integrity: Goal: Risk for impaired skin integrity will decrease Outcome: Adequate for Discharge   Problem: Education: Goal: Knowledge of disease or condition will improve Outcome: Adequate for Discharge Goal: Knowledge of secondary prevention will improve (SELECT ALL) Outcome: Adequate for Discharge   Problem: Acute Rehab PT Goals(only PT should resolve) Goal: Pt Will Go Supine/Side To Sit Outcome: Adequate for Discharge Goal: Patient Will Transfer Sit To/From Stand Outcome: Adequate for Discharge Goal: Pt Will Transfer Bed To Chair/Chair To Bed Outcome: Adequate for Discharge Goal: Pt Will Ambulate Outcome: Adequate for Discharge Goal: Pt Will Go Up/Down Stairs Outcome: Adequate for Discharge Goal: Pt/caregiver will Perform Home Exercise Program Outcome: Adequate for Discharge   Problem: SLP Cognition Goals Goal: Patient will utilize external memory aids Description: Patient will utilize external memory  aids to facilitate recall of information for improved safety with Outcome: Adequate for Discharge Goal: Patient will demonstrate problem solving skills Description: Patient will demonstrate problem solving skills during functional ADL's with Outcome: Adequate for Discharge   Problem: SLP Language Goals Goal: Patient will utilize speech intelligibility Description: Patient will utilize speech intelligibility strategies to  enhance communication with Outcome: Adequate for Discharge

## 2022-02-24 NOTE — Telephone Encounter (Signed)
Pharmacy Patient Advocate Encounter  Insurance verification completed.    The patient is insured through [employer]   The patient is currently admitted and ran test claims for the following: Ozempic, Byetta and Trulicity.  Ozempic was not covered and the insurance prefers Trulicity or Byetta.   Copays and coinsurance results were relayed to Inpatient clinical team.

## 2022-02-24 NOTE — TOC Benefit Eligibility Note (Signed)
Patient Teacher, English as a foreign language completed.    The current 30 day co-pay is $148.83 for Byetta and $92 for Trulicity  The patient is insured through [employer]  Otis [employer], [place] Patient Advocate Specialist [place] Patient Advocate Team Direct Number: [phone]  Fax: [phone]

## 2022-02-24 NOTE — Progress Notes (Addendum)
     Daily Progress Note Intern Pager: [phone]  Patient name: Paul Dickerson Medical record number: [id number] Date of birth: 12-Oct-1960 Age: 61 y.o. Gender: male  Primary Care Provider: No primary care provider on file. Consultants: neurology Code Status: FULL  Pt Overview and Major Events to Date:  9/24: Admit to FMTS   Assessment and Plan: RASHAWD LASKARIS is a 61 y.o. 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 PMHx pertinent for HTN uncontrolled, diabetes uncontrolled.  * Stroke [facility]) MRI with acute lacunar infarct of L corona radiata and posterior L lentiform. 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    - neurology following, appreciate assistance - start losartan 50mg  daily for uncontrolled HTN - Aspirin 81mg  daily and plavix for 3 weeks then ASA 81 monotherapy - atorvastatin 40mg  daily  -SLP evaluated, stated she could go home    Hypokalemia Replete K  -KClor 40 x1  Encounter for social work intervention TOC needs for PCP. Also needs preventative healthcare.   Elevated hemoglobin A1c 8.6 here, CBG q4 AC & QHS. Started on very sensitive SSI.  -plan on GLP1 outpatient   Alcohol use 15 beers in 2 days, CIWAs 0 x 24 hrs. No history of DTs or alcohol withdrawal.  -started on thiamine and folic acid  HTN (hypertension) Known history of HTN with med noncompliance  - starting losartan today   FEN/GI: carb modified PPx: lovenox Dispo:Home today. Barriers include SLP eval.   Subjective:  Patient is sitting up in bed in no acute distress. He denies chest pain, abdominal pain. Discussed importance of taking medications on discharge. He was in agreement.   Objective: Temp:  [97.9 F (36.6 C)-98.4 F (36.9 C)] 98 F (36.7 C) (09/26 0458) Pulse Rate:  [67-77] 67 (09/26 0458) Resp:  [16-19] 19 (09/26 0458) BP: (158-198)/(91-105) 177/100 (09/26  0458) SpO2:  [96 %-99 %] 96 % (09/26 0458)  Physical Exam: General: alert, well-appearing African American male in no acute distress Cardiovascular: RRR. No m/r/g. Respiratory: CTAB. Normal WOB on RA.  Abdomen: Soft, non-tender, non-distended Extremities: Trace peripheral edema  Neuro: dysarthria, R facial droop. Otherwise no focal deficits.  Laboratory: Most recent CBC Lab Results  Component Value Date   WBC 4.2 02/24/2022   HGB 14.8 02/24/2022   HCT 46.0 02/24/2022   MCV 76.0 (L) 02/24/2022   PLT 188 02/24/2022   Most recent BMP    Latest Ref Rng & Units 02/24/2022    3:38 AM  BMP  Glucose 70 - 99 mg/dL 143   BUN 6 - 20 mg/dL 8   Creatinine 0.61 - 1.24 mg/dL 1.08   Sodium 135 - 145 mmol/L 137   Potassium 3.5 - 5.1 mmol/L 3.3   Chloride 98 - 111 mmol/L 103   CO2 22 - 32 mmol/L 25   Calcium 8.9 - 10.3 mg/dL 9.4    Echo LVEF 45-50%. Grade 1 Diastolic dysfunction.   Rolanda Lundborg, MD 02/24/2022, 8:03 AM  PGY-1, [place] Intern pager: [phone], text pages welcome Secure chat group [place]

## 2022-02-24 NOTE — Progress Notes (Signed)
Discharge instructions (including medications) discussed with and copy provided to patient/caregiver 

## 2022-03-04 ENCOUNTER — Encounter: Payer: 59 | Admitting: Internal Medicine

## 2022-03-05 ENCOUNTER — Ambulatory Visit: Payer: Commercial Managed Care - HMO

## 2022-03-05 ENCOUNTER — Telehealth: Payer: Self-pay

## 2022-03-05 NOTE — Telephone Encounter (Signed)
Patient Name: Paul Dickerson MRN: [id number] DOB:08-29-1960, 61 y.o., male Today's Date: 03/05/2022  Patient did not show for his PT eval scheduled at 1015am on 03/05/22. Called patient and spoke with patient. Patient thought his appt was for next week. We rescheduled his appt for 03/10/22 at 2:45pm. Asked patient write down our address and appt date and time. Reminded patient that today's appt would be considered no show and reminded him importance of therapy after his stroke. Pt verbalized understanding.   Kerrie Pleasure, PT 03/05/2022, 10:40 AM

## 2022-03-10 ENCOUNTER — Ambulatory Visit: Payer: 59 | Attending: Family Medicine | Admitting: Physical Therapy

## 2022-03-10 DIAGNOSIS — I639 Cerebral infarction, unspecified: Secondary | ICD-10-CM | POA: Insufficient documentation

## 2022-03-10 DIAGNOSIS — R2689 Other abnormalities of gait and mobility: Secondary | ICD-10-CM | POA: Insufficient documentation

## 2022-03-10 NOTE — Therapy (Signed)
  OUTPATIENT PHYSICAL THERAPY NEURO EVALUATION- ARRIVED NO CHARGE   Patient Name: Paul Dickerson MRN: [id number] DOB:[birth year], 61 y.o., male Today's Date: 03/10/2022   PCP: Martyn Malay, MD REFERRING PROVIDER: Martyn Malay, MD    PT End of Session - 03/10/22 1451     Visit Number 1    Number of Visits 1    Authorization Type [employer] (30 VL w/chiro, PT, OT)    PT Start Time 1450   PT running late   PT Stop Time 1505    PT Time Calculation (min) 15 min    Behavior During Therapy WFL for tasks assessed/performed             Past Medical History:  Diagnosis Date   Hypertension    Type 2 diabetes mellitus ([place])    No past surgical history on file. Patient Active Problem List   Diagnosis Date Noted   Hypokalemia 02/23/2022   Stroke ([place]) 02/22/2022   HTN (hypertension) 02/22/2022   Alcohol use 02/22/2022   Elevated hemoglobin A1c 02/22/2022   Encounter for social work intervention 02/22/2022    ONSET DATE: 02/23/2022   REFERRING DIAG: I63.9 (ICD-10-CM) - Cerebrovascular accident (CVA), unspecified mechanism ([place])   THERAPY DIAG:  Other abnormalities of gait and mobility  Rationale for Evaluation and Treatment Rehabilitation  SUBJECTIVE:                                                                                                                                                                                              SUBJECTIVE STATEMENT: Pt ambulated into clinic without AD. Pt states that he "does not need" therapy and is unsure why he is here. Educated pt on stroke rehab and what PT/OT/SLP can offer. Pt declines therapy at this time, as he states he is "doing every thing I need to do". Pt states he is driving, but only for short distances, as longer distances make him nervous. Informed pt on how to obtain referral for PT/OT/SLP in future if his mobility needs change. Pt verbalized understanding.   Pt accompanied by: self  PERTINENT HISTORY:  presented on 9/24 with slurred speech, R facial droop. MRI shows Age-indeterminate infarct versus chronic ischemic changes in the LEFT periventricular white matter. HTN, HLD and ETOH abuse (7 beers/day).      GOALS: N/A DUE TO PT DECLINING THERAPY    PLAN: PT FREQUENCY: one time visit   Reshard Guillet E Tauheedah Bok, PT, DPT 03/10/2022, 3:15 PM

## 2022-03-12 ENCOUNTER — Ambulatory Visit (INDEPENDENT_AMBULATORY_CARE_PROVIDER_SITE_OTHER): Payer: 59 | Admitting: Student

## 2022-03-12 VITALS — BP 183/96 | HR 80 | Ht 71.0 in | Wt 242.5 lb

## 2022-03-12 DIAGNOSIS — R7309 Other abnormal glucose: Secondary | ICD-10-CM

## 2022-03-12 DIAGNOSIS — I10 Essential (primary) hypertension: Secondary | ICD-10-CM | POA: Diagnosis not present

## 2022-03-12 DIAGNOSIS — Z789 Other specified health status: Secondary | ICD-10-CM | POA: Diagnosis not present

## 2022-03-12 MED ORDER — TRULICITY 0.75 MG/0.5ML ~~LOC~~ SOAJ: 0.7500 mg | SUBCUTANEOUS | 4 refills | Status: DC

## 2022-03-12 MED ORDER — LOSARTAN POTASSIUM 100 MG PO TABS: 100.0000 mg | ORAL_TABLET | Freq: Every day | ORAL | 3 refills | Status: DC

## 2022-03-12 NOTE — Patient Instructions (Addendum)
It was great to see you today! Thank you for choosing [facility] for your primary care. Paul Dickerson was seen for hospital follow up.  Today we addressed: You have a cardiology appt on 10/25 I have ordered Trulicity to start at 7.78 to take WEEKLY Stop the metformin  You will stop the PLAVIX on 10/16  If you haven't already, sign up for My Chart to have easy access to your labs results, and communication with your primary care physician.  We are checking some labs today. If they are abnormal, I will call you. If they are normal, I will send you a MyChart message (if it is active) or a letter in the mail. If you do not hear about your labs in the next 2 weeks, please call the office. I recommend that you always bring your medications to each appointment as this makes it easy to ensure you are on the correct medications and helps [place] not miss refills when you need them. Call the clinic at [phone] if your symptoms worsen or you have any concerns.  You should return to our clinic Return in about 2 months (around 05/12/2022) for DM2. Please arrive 15 minutes before your appointment to ensure smooth check in process.  We appreciate your efforts in making this happen.  Thank you for allowing me to participate in your care, Erskine Emery, MD 03/12/2022, 3:37 PM PGY-3, [place]

## 2022-03-12 NOTE — Progress Notes (Signed)
    SUBJECTIVE:   CHIEF COMPLAINT / HPI:   Hospital f/u: Acute CVA with dysarthria on admission to [facility], discharged 02/24/22 after seeing neurology.  Patient found to have diabetes and hypertension while in hospital.  He was discharged on metformin, DAPT therapy, losartan.  He also has a history of alcohol use which was monitored in the hospital, no DTs or alcohol withdrawal noted.  Today the patient reports that his dysarthria has improved greatly since his admission.  He has been compliant with his medications and has no concerns.   PERTINENT  PMH / PSH: History of CVA, hypertension, diabetes  OBJECTIVE:   BP (!) 183/96   Pulse 80   Ht 5\' 11"  (1.803 m)   Wt 242 lb 8 oz (110 kg)   SpO2 100%   BMI 33.82 kg/m   General: Alert and oriented in no apparent distress Heart: Regular rate and rhythm with no murmurs appreciated Lungs: CTA bilaterally, no wheezing Abdomen: Bowel sounds present, no abdominal pain Skin: Warm and dry Extremities: No lower extremity edema Neuro: CN II: PERRL CN III, IV,VI: EOMI CV V: Normal sensation in V1, V2, V3 CVII: Asymmetric smile and brow raise, decreased on right with dysarthria CN VIII: Normal hearing CN XI: 5/5 shoulder shrug UE and LE strength 5/5 Normal sensation in UE and LE bilaterally     ASSESSMENT/PLAN:   Alcohol use We will continue to monitor outpatient, stable.  Elevated hemoglobin X6I Changed to Trulicity from metformin for diabetes.  To decrease medication burden, will discontinue metformin at this time.  It may be reasonable for him to continue with metformin and Trulicity at the same time.  Continue with appropriate follow-up.  HTN (hypertension) Increase dosage of losartan to 100 mg.  Up-to-date on BMP but will recheck today.     Erskine Emery, MD [place]

## 2022-03-13 LAB — BASIC METABOLIC PANEL
BUN/Creatinine Ratio: 9 — ABNORMAL LOW (ref 10–24)
BUN: 8 mg/dL (ref 8–27)
CO2: 20 mmol/L (ref 20–29)
Calcium: 9.9 mg/dL (ref 8.6–10.2)
Chloride: 101 mmol/L (ref 96–106)
Creatinine, Ser: 0.93 mg/dL (ref 0.76–1.27)
Glucose: 140 mg/dL — ABNORMAL HIGH (ref 70–99)
Potassium: 4.1 mmol/L (ref 3.5–5.2)
Sodium: 138 mmol/L (ref 134–144)
eGFR: 94 mL/min/{1.73_m2} (ref >59)

## 2022-03-13 NOTE — Assessment & Plan Note (Signed)
Changed to Trulicity from metformin for diabetes.  To decrease medication burden, will discontinue metformin at this time.  It may be reasonable for him to continue with metformin and Trulicity at the same time.  Continue with appropriate follow-up.

## 2022-03-13 NOTE — Assessment & Plan Note (Addendum)
We will continue to monitor outpatient, stable.

## 2022-03-13 NOTE — Assessment & Plan Note (Signed)
Increase dosage of losartan to 100 mg.  Up-to-date on BMP but will recheck today.

## 2022-03-20 ENCOUNTER — Encounter: Payer: Self-pay | Admitting: Student

## 2022-03-25 ENCOUNTER — Ambulatory Visit: Payer: Commercial Managed Care - HMO | Attending: Cardiology | Admitting: Cardiology

## 2022-03-25 NOTE — Progress Notes (Deleted)
Cardiology Office Note:    Date:  03/25/2022   ID:  Paul Dickerson, DOB 08-Jul-1960, MRN [id number]  PCP:  Erskine Emery, MD  Cardiologist:  None  Electrophysiologist:  None   Referring MD: No ref. provider found   No chief complaint on file. ***  History of Present Illness:    Paul Dickerson is a 61 y.o. male with a hx of CVA, hypertension, J6BH, systolic dysfunction who is referred for evaluation of systolic dysfunction.  He was admitted in September 2023 with acute CVA.  MRI showed acute infarct in left corona radiata as well as multiple old infarcts.  CTA head and neck showed moderate bilateral ICA stenosis.  Echo showed EF 45 to 50%, Mild LVH, G1DD, small pericardial effusion, mild MR.  He was started on DAPT for 3 weeks and plan for aspirin monotherapy.  Etiology per neurology felt to be small vessel disease.  Since discharge from the hospital,  Past Medical History:  Diagnosis Date   Hypertension    Type 2 diabetes mellitus ([place])     No past surgical history on file.  Current Medications: No outpatient medications have been marked as taking for the 03/25/22 encounter (Appointment) with Donato Heinz, MD.     Allergies:   Patient has no known allergies.   Social History   Socioeconomic History   Marital status: Married    Spouse name: Not on file   Number of children: Not on file   Years of education: Not on file   Highest education level: Not on file  Occupational History   Not on file  Tobacco Use   Smoking status: Not on file   Smokeless tobacco: Not on file  Substance and Sexual Activity   Alcohol use: Not on file   Drug use: Not on file   Sexual activity: Not on file  Other Topics Concern   Not on file  Social History Narrative   Not on file   Social Determinants of Health   Financial Resource Strain: Not on file  Food Insecurity: Not on file  Transportation Needs: Not on file  Physical Activity: Not on file  Stress: Not on  file  Social Connections: Not on file     Family History: The patient's ***family history is not on file.  ROS:   Please see the history of present illness.    *** All other systems reviewed and are negative.  EKGs/Labs/Other Studies Reviewed:    The following studies were reviewed today: ***  EKG:  EKG is *** ordered today.  The ekg ordered today demonstrates ***  Recent Labs: 02/23/2022: TSH 1.515 02/24/2022: ALT 46; Hemoglobin 14.8; Platelets 188 03/12/2022: BUN 8; Creatinine, Ser 0.93; Potassium 4.1; Sodium 138  Recent Lipid Panel    Component Value Date/Time   CHOL 221 (H) 02/23/2022 0736   TRIG 56 02/23/2022 0736   HDL 48 02/23/2022 0736   CHOLHDL 4.6 02/23/2022 0736   VLDL 11 02/23/2022 0736   LDLCALC 162 (H) 02/23/2022 0736    Physical Exam:    VS:  There were no vitals taken for this visit.    Wt Readings from Last 3 Encounters:  03/12/22 242 lb 8 oz (110 kg)  02/22/22 220 lb (99.8 kg)     GEN: *** Well nourished, well developed in no acute distress HEENT: Normal NECK: No JVD; No carotid bruits LYMPHATICS: No lymphadenopathy CARDIAC: ***RRR, no murmurs, rubs, gallops RESPIRATORY:  Clear to auscultation without rales, wheezing or rhonchi  ABDOMEN: Soft, non-tender, non-distended MUSCULOSKELETAL:  No edema; No deformity  SKIN: Warm and dry NEUROLOGIC:  Alert and oriented x 3 PSYCHIATRIC:  Normal affect   ASSESSMENT:    No diagnosis found. PLAN:    Systolic dysfunction: Echo 02/23/2022 showed EF 45 to 50%, basal inferior/inferolateral hypokinesis, grade 1 diastolic dysfunction, mild RV dysfunction, small pericardial effusion, mild MR. -Coronary CTA to evaluate for obstructive CAD -Continue losartan 100 mg daily -Add Toprol-XL 25 mg daily  Acute CVA: admitted in September 2023 with acute CVA.  MRI showed acute infarct in left corona radiata as well as multiple old infarcts.  CTA head and neck showed moderate bilateral ICA stenosis.   He was started on  DAPT for 3 weeks and plan for aspirin monotherapy.  Etiology per neurology felt to be small vessel disease. -Continue aspirin 81 mg daily -Continue atorvastatin 40 mg daily  Hypertension: On losartan 100 mg daily.  Add Toprol-XL as above  T2DM: A1 C8 0.3% on 02/22/2022.  On metformin  Hyperlipidemia: LDL 162 on 02/23/2022.  Started on atorvastatin 40 mg daily.  RTC in***  Medication Adjustments/Labs and Tests Ordered: Current medicines are reviewed at length with the patient today.  Concerns regarding medicines are outlined above.  No orders of the defined types were placed in this encounter.  No orders of the defined types were placed in this encounter.   There are no Patient Instructions on file for this visit.   Signed, Little Ishikawa, MD  03/25/2022 12:31 PM    [facility]

## 2022-03-26 ENCOUNTER — Other Ambulatory Visit (HOSPITAL_COMMUNITY): Payer: Self-pay

## 2022-03-26 ENCOUNTER — Other Ambulatory Visit: Payer: Self-pay | Admitting: Student

## 2022-03-26 ENCOUNTER — Other Ambulatory Visit: Payer: Self-pay

## 2022-03-26 MED ORDER — METFORMIN HCL ER 500 MG PO TB24
500.0000 mg | ORAL_TABLET | Freq: Every day | ORAL | 2 refills | Status: DC
  Filled 2022-03-26 (×2): qty 30, 30d supply, fill #0

## 2022-04-03 ENCOUNTER — Other Ambulatory Visit: Payer: Self-pay | Admitting: Student

## 2022-04-03 DIAGNOSIS — R7309 Other abnormal glucose: Secondary | ICD-10-CM

## 2022-04-06 ENCOUNTER — Telehealth: Payer: Self-pay

## 2022-04-06 NOTE — Telephone Encounter (Signed)
Patient returns call to nurse line. Informed of information from phone note.   Talbot Grumbling, RN

## 2022-04-06 NOTE — Telephone Encounter (Signed)
Patient calls nurse line in regards to Trulicity.  Patient reports difficulty picking this up.   I called the pharmacy and they have not filled prescription due to not having his insurance information.   I attempted to call patient back to inform, however no answer.   Patient needs to take his insurance card to the pharmacy.

## 2022-04-07 ENCOUNTER — Other Ambulatory Visit (HOSPITAL_COMMUNITY): Payer: Self-pay

## 2022-04-20 ENCOUNTER — Other Ambulatory Visit (HOSPITAL_COMMUNITY): Payer: Self-pay

## 2022-04-20 ENCOUNTER — Other Ambulatory Visit: Payer: Self-pay | Admitting: Student

## 2022-04-20 MED ORDER — ATORVASTATIN CALCIUM 40 MG PO TABS
40.0000 mg | ORAL_TABLET | Freq: Every day | ORAL | 2 refills | Status: DC
  Filled 2022-04-20: qty 30, 30d supply, fill #0
  Filled 2022-05-21: qty 30, 30d supply, fill #1
  Filled 2022-06-19 (×2): qty 30, 30d supply, fill #2

## 2022-04-20 MED ORDER — FOLIC ACID 1 MG PO TABS
1.0000 mg | ORAL_TABLET | Freq: Every day | ORAL | 1 refills | Status: AC
  Filled 2022-04-20: qty 30, 30d supply, fill #0
  Filled 2022-05-21: qty 30, 30d supply, fill #1

## 2022-05-19 ENCOUNTER — Ambulatory Visit (INDEPENDENT_AMBULATORY_CARE_PROVIDER_SITE_OTHER): Payer: Self-pay | Admitting: Student

## 2022-05-19 ENCOUNTER — Ambulatory Visit: Payer: Commercial Managed Care - HMO | Admitting: Student

## 2022-05-19 VITALS — BP 145/80 | HR 87 | Ht 71.0 in | Wt 231.0 lb

## 2022-05-19 DIAGNOSIS — E119 Type 2 diabetes mellitus without complications: Secondary | ICD-10-CM

## 2022-05-19 DIAGNOSIS — Z23 Encounter for immunization: Secondary | ICD-10-CM

## 2022-05-19 DIAGNOSIS — I10 Essential (primary) hypertension: Secondary | ICD-10-CM

## 2022-05-19 MED ORDER — METFORMIN HCL ER 500 MG PO TB24: 500.0000 mg | ORAL_TABLET | Freq: Every day | ORAL | 2 refills | Status: DC

## 2022-05-19 MED ORDER — HYDROCHLOROTHIAZIDE 25 MG PO TABS: 25.0000 mg | ORAL_TABLET | Freq: Every day | ORAL | 3 refills | Status: DC

## 2022-05-19 MED ORDER — TRULICITY 0.75 MG/0.5ML ~~LOC~~ SOAJ: SUBCUTANEOUS | 4 refills | Status: DC

## 2022-05-19 NOTE — Assessment & Plan Note (Signed)
Not for another A1c for another month.  Patient was given a prescription for Trulicity and metformin to take for diabetes.  Take metformin daily and Trulicity weekly.  Will have him come in for repeat A1c and to check his high blood pressure in a couple of months.   Patient also needs hep C, HIV, foot exam.  Will assess this next visit

## 2022-05-19 NOTE — Patient Instructions (Addendum)
It was great to see you today! Thank you for choosing [facility] for your primary care. Paul Dickerson was seen for follow up.  Today we addressed: Your diabetes: Continue with Trulicity and metformin as ordered. For your high blood pressure: We will continue with a medication called hydrochlorothiazide 25 mg daily in addition to your losartan 100 mg daily. Please follow-up in 1 to 2 months for blood work and to follow-up on your blood pressure  Cardiology:  Address: [address], [place], [place] [address] Hours:  Open ? Closes 5?PM Phone: [phone]  You will need a colonoscopy at some point as well   If you haven't already, sign up for My Chart to have easy access to your labs results, and communication with your primary care physician.  I recommend that you always bring your medications to each appointment as this makes it easy to ensure you are on the correct medications and helps [place] not miss refills when you need them. Call the clinic at [phone] if your symptoms worsen or you have any concerns.  You should return to our clinic Return in about 2 months (around 07/20/2022) for a1c, HTN. Please arrive 15 minutes before your appointment to ensure smooth check in process.  We appreciate your efforts in making this happen.  Thank you for allowing me to participate in your care, Alfredo Martinez, MD 05/19/2022, 2:53 PM PGY-2, [facility]

## 2022-05-19 NOTE — Progress Notes (Signed)
  SUBJECTIVE:   CHIEF COMPLAINT / HPI:   Patient with new onset diabetes after recent admission for stroke.  Continued with Trulicity at last visit on 03/12/2022.  He was also given an increased dosage of losartan to 100 mg for treatment of high blood pressure.   Patient has been doing well since last visit.  He reports that he has been unable to use Jardiance since October due to an insurance problem.  He does report though that he would be able to pick up a refill of Jardiance.  The patient has been taking metformin instead of Jardiance since October.  He reports feeling well and has improvement of his dysarthria.  He is not going to physical therapy currently.  He takes his losartan and metformin daily.  Now only takes approximately 1 beer every couple of weeks  PERTINENT  PMH / PSH:   Past Medical History:  Diagnosis Date   Hypertension    Type 2 diabetes mellitus ([facility])   H/O Stroke   OBJECTIVE:  BP (!) 145/80 Comment: provider informed  Pulse 87   Ht 5\' 11"  (1.803 m)   Wt 231 lb (104.8 kg)   SpO2 98%   BMI 32.22 kg/m  Physical Exam  General: Alert and oriented in no apparent distress, improving dysarthria Heart: Regular rate and rhythm with no murmurs appreciated Lungs: CTA bilaterally, no wheezing Abdomen: Bowel sounds present, no abdominal pain Skin: Warm and dry Extremities: No lower extremity edema   ASSESSMENT/PLAN:  Type 2 diabetes mellitus without complication, without long-term current use of insulin (H[facility]) Assessment & Plan: Not for another A1c for another month.  Patient was given a prescription for Trulicity and metformin to take for diabetes.  Take metformin daily and Trulicity weekly.  Will have him come in for repeat A1c and to check his high blood pressure in a couple of months.   Patient also needs hep C, HIV, foot exam.  Will assess this next visit  Orders: -     Trulicity; ADMINISTER 0.75 MG UNDER THE SKIN 1 TIME A WEEK  Dispense: 0.5 mL; Refill: 4 -      metFORMIN HCl ER; Take 1 tablet (500 mg total) by mouth daily with breakfast.  Dispense: 30 tablet; Refill: 2  Need for immunization against influenza -     Flu Vaccine QUAD 76mo+IM (Fluarix, Fluzone & Alfiuria Quad PF)  Hypertension, unspecified type Assessment & Plan: Elevated today, continuing with losartan 100 and adding HCTZ 25 mg, BMP up-to-date.  Orders: -     hydroCHLOROthiazide; Take 1 tablet (25 mg total) by mouth daily.  Dispense: 90 tablet; Refill: 3   Return in about 2 months (around 07/20/2022) for a1c, HTN. 07/22/2022, MD 05/19/2022, 3:26 PM PGY-2, [facility]

## 2022-05-19 NOTE — Assessment & Plan Note (Signed)
Elevated today, continuing with losartan 100 and adding HCTZ 25 mg, BMP up-to-date.

## 2022-06-19 ENCOUNTER — Other Ambulatory Visit (HOSPITAL_COMMUNITY): Payer: Self-pay

## 2022-06-19 ENCOUNTER — Other Ambulatory Visit: Payer: Self-pay

## 2022-06-22 ENCOUNTER — Other Ambulatory Visit: Payer: Self-pay

## 2022-07-06 ENCOUNTER — Ambulatory Visit: Payer: Self-pay | Admitting: Student

## 2022-07-06 ENCOUNTER — Encounter: Payer: Self-pay | Admitting: Student

## 2022-07-06 ENCOUNTER — Ambulatory Visit (INDEPENDENT_AMBULATORY_CARE_PROVIDER_SITE_OTHER): Payer: Medicaid Other | Admitting: Student

## 2022-07-06 VITALS — BP 130/81 | HR 89 | Ht 71.0 in | Wt 231.4 lb

## 2022-07-06 DIAGNOSIS — E1169 Type 2 diabetes mellitus with other specified complication: Secondary | ICD-10-CM

## 2022-07-06 DIAGNOSIS — Z113 Encounter for screening for infections with a predominantly sexual mode of transmission: Secondary | ICD-10-CM | POA: Diagnosis not present

## 2022-07-06 DIAGNOSIS — I639 Cerebral infarction, unspecified: Secondary | ICD-10-CM | POA: Diagnosis not present

## 2022-07-06 DIAGNOSIS — I10 Essential (primary) hypertension: Secondary | ICD-10-CM

## 2022-07-06 LAB — POCT GLYCOSYLATED HEMOGLOBIN (HGB A1C): HbA1c, POC (controlled diabetic range): 8.1 % — AB (ref 0.0–7.0)

## 2022-07-06 MED ORDER — SEMAGLUTIDE(0.25 OR 0.5MG/DOS) 2 MG/1.5ML ~~LOC~~ SOPN: 0.2500 mg | PEN_INJECTOR | SUBCUTANEOUS | 3 refills | Status: DC

## 2022-07-06 MED ORDER — AMLODIPINE BESYLATE 5 MG PO TABS: 5.0000 mg | ORAL_TABLET | Freq: Every day | ORAL | 3 refills | Status: DC

## 2022-07-06 NOTE — Assessment & Plan Note (Addendum)
stable - Last A1c:  Lab Results  Component Value Date   HGBA1C 8.1 (A) 07/06/2022   - Medications: GLP1 added and metformin  - Compliance: Good  - Needs Uacr and foot exam  - Eye doctor options given

## 2022-07-06 NOTE — Patient Instructions (Addendum)
It was great to see you today! Thank you for choosing [facility] for your primary care. Paul Dickerson was seen for follow up.  Today we addressed: -Continue with Metformin and add Ozempic  -Continue normal BP medications, NO BLOOD PRESSURE MEDICATION CHANGES  -I am ordering some routine labs    Optometrists who accept Medicaid   Accepts Medicaid for Eye Exam and [place] [address] Phone: [phone]  Open Monday- Saturday from 9 AM to 5 PM Ages 6 months and older Se habla Espaol [facility] at [facility] Phone: [phone] Open Monday -Friday (by appointment only) Ages 71 and older No se habla Espaol   [facility] at [facility] [place], [place] Phone: [phone] Open Monday-Saturday Ages 8 years and older Se habla Espaol  [facility] [place] [phone] [place], [place]  Phone: [phone] Open Monday-Friday Ages 5 years and older  [place]. Phone: [phone] Open Monday-Friday Ages 57 and older No se habla Espaol  [facility] [place] [address] Phone: [phone] Age 5 year old and older Open [place] at [facility] [place] Phone: [phone] Open Monday-Friday Ages 75 and older No se habla Espaol         Accepts Medicaid for Eye Exam only (will have to pay for glasses)  [place] Phone: [phone] Open 7 days per week Ages 5 and older (must know alphabet) No se [place]  Phone: [phone] Open 7 days per week Ages 66 and older (must know alphabet) No se habla Espaol   [place], [address] Phone: [phone] Open Monday-Saturday Ages 6 years and older [place] [address] [place] Phone: [phone] Open 7 days per week Ages 5 and older (must know alphabet) No se habla Espaol      If you haven't already, sign up for My Chart to have easy access to your labs results, and communication with your primary care physician.  I recommend that you always bring your medications to each appointment as this makes it easy to ensure you are on the correct medications and helps [place] not miss refills when you need them. Call the clinic at [phone] if your symptoms worsen or you have any concerns.  You should return to our clinic Return in about 3 months (around 10/04/2022). Please arrive 15 minutes before your appointment to ensure smooth check in process.  We appreciate your efforts in making this happen.  Thank you for allowing me to participate in your care, Erskine Emery, MD 07/06/2022, 2:27 PM PGY-2, [place]

## 2022-07-06 NOTE — Assessment & Plan Note (Signed)
BP: 130/81 today. Well controlled. Goal of <140/90. Continue to work on healthy dietary habits and exercise. Follow up in 3 mths  Medication regimen: Well-controlled on HCTZ, losartan If blood pressure becomes elevated in coming months, consider amlodipine as third agent.

## 2022-07-06 NOTE — Assessment & Plan Note (Signed)
ASA and statin 

## 2022-07-06 NOTE — Progress Notes (Signed)
  SUBJECTIVE:   CHIEF COMPLAINT / HPI:   Type 2 Diabetes: Home medications include: Trulicity 3.15 mg weekly, metformin. Does not endorse compliance, only taking metformin. Home glucose monitoring is not performed.   Most recent A1Cs:  Lab Results  Component Value Date   HGBA1C 8.1 (A) 07/06/2022   HGBA1C 8.3 (H) 02/22/2022   Last Microalbumin, LDL, Creatinine: Lab Results  Component Value Date   LDLCALC 162 (H) 02/23/2022   CREATININE 0.93 03/12/2022    Patient is not up to date on diabetic eye. Patient is not up to date on diabetic foot exam.   PERTINENT  PMH / PSH:   Past Medical History:  Diagnosis Date   Hypertension    Type 2 diabetes mellitus ([place])     OBJECTIVE:  BP 130/81   Pulse 89   Ht 5\' 11"  (1.803 m)   Wt 231 lb 6 oz (105 kg)   SpO2 100%   BMI 32.27 kg/m  Physical Exam  General: Alert and oriented in no apparent distress; AAM pleasant, mild dysarthria-stable  Heart: Regular rate and rhythm with no murmurs appreciated Lungs: CTA bilaterally, no wheezing Abdomen: Bowel sounds present, no abdominal pain Skin: Warm and dry Neuro: Unchanged from previous    ASSESSMENT/PLAN:  Type 2 diabetes mellitus with other specified complication, without long-term current use of insulin (HCC) Assessment & Plan: stable - Last A1c:  Lab Results  Component Value Date   HGBA1C 8.1 (A) 07/06/2022   - Medications: GLP1 added and metformin  - Compliance: Good  - Needs Uacr and foot exam  - Eye doctor options given    Orders: -     POCT glycosylated hemoglobin (Hb A1C) -     Semaglutide(0.25 or 0.5MG /DOS); Inject 0.25 mg into the skin once a week. 0.25 mg once weekly for 4 weeks then increase to 0.5 mg weekly for at least 4 weeks,max 1 mg  Dispense: 3 mL; Refill: 3  Routine screening for STI (sexually transmitted infection) -     HIV Antibody (routine testing w rflx) -     Hepatitis C antibody  Hypertension, unspecified type Assessment & Plan: BP: 130/81  today. Well controlled. Goal of <140/90. Continue to work on healthy dietary habits and exercise. Follow up in 3 mths  Medication regimen: Well-controlled on HCTZ, losartan If blood pressure becomes elevated in coming months, consider amlodipine as third agent.    Cerebrovascular accident (CVA), unspecified mechanism ([place]) Assessment & Plan: ASA and statin     Return in about 3 months (around 10/04/2022). Erskine Emery, MD 07/06/2022, 2:50 PM PGY-2, [place]

## 2022-07-07 LAB — HIV ANTIBODY (ROUTINE TESTING W REFLEX): HIV Screen 4th Generation wRfx: NONREACTIVE

## 2022-07-07 LAB — HEPATITIS C ANTIBODY: Hep C Virus Ab: NONREACTIVE

## 2022-07-09 ENCOUNTER — Other Ambulatory Visit (HOSPITAL_COMMUNITY): Payer: Self-pay

## 2022-07-12 ENCOUNTER — Other Ambulatory Visit: Payer: Self-pay | Admitting: Student

## 2022-07-12 DIAGNOSIS — I10 Essential (primary) hypertension: Secondary | ICD-10-CM

## 2022-07-13 ENCOUNTER — Other Ambulatory Visit: Payer: Self-pay | Admitting: Student

## 2022-07-13 ENCOUNTER — Other Ambulatory Visit (HOSPITAL_COMMUNITY): Payer: Self-pay

## 2022-07-13 MED ORDER — ATORVASTATIN CALCIUM 40 MG PO TABS
40.0000 mg | ORAL_TABLET | Freq: Every day | ORAL | 2 refills | Status: DC
  Filled 2022-07-13: qty 30, 30d supply, fill #0
  Filled 2022-08-18 (×2): qty 30, 30d supply, fill #1
  Filled 2022-09-12: qty 30, 30d supply, fill #2

## 2022-08-04 ENCOUNTER — Other Ambulatory Visit: Payer: Self-pay | Admitting: Student

## 2022-08-04 DIAGNOSIS — E119 Type 2 diabetes mellitus without complications: Secondary | ICD-10-CM

## 2022-08-18 ENCOUNTER — Other Ambulatory Visit (HOSPITAL_COMMUNITY): Payer: Self-pay

## 2022-09-14 ENCOUNTER — Other Ambulatory Visit (HOSPITAL_COMMUNITY): Payer: Self-pay

## 2022-10-08 ENCOUNTER — Other Ambulatory Visit: Payer: Self-pay | Admitting: Student

## 2022-10-09 ENCOUNTER — Other Ambulatory Visit (HOSPITAL_COMMUNITY): Payer: Self-pay

## 2022-10-09 ENCOUNTER — Other Ambulatory Visit: Payer: Self-pay | Admitting: Student

## 2022-10-09 MED ORDER — ATORVASTATIN CALCIUM 40 MG PO TABS: 40.0000 mg | ORAL_TABLET | Freq: Every day | ORAL | 2 refills | Status: DC

## 2022-10-14 ENCOUNTER — Other Ambulatory Visit (HOSPITAL_COMMUNITY): Payer: Self-pay

## 2022-10-23 ENCOUNTER — Other Ambulatory Visit: Payer: Self-pay | Admitting: Student

## 2022-10-23 ENCOUNTER — Other Ambulatory Visit (HOSPITAL_COMMUNITY): Payer: Self-pay

## 2022-10-30 ENCOUNTER — Other Ambulatory Visit (HOSPITAL_COMMUNITY): Payer: Self-pay

## 2022-10-31 ENCOUNTER — Other Ambulatory Visit: Payer: Self-pay | Admitting: Student

## 2022-10-31 DIAGNOSIS — E119 Type 2 diabetes mellitus without complications: Secondary | ICD-10-CM

## 2022-11-11 ENCOUNTER — Other Ambulatory Visit: Payer: Self-pay | Admitting: Student

## 2022-11-11 DIAGNOSIS — I10 Essential (primary) hypertension: Secondary | ICD-10-CM

## 2022-11-12 ENCOUNTER — Other Ambulatory Visit (HOSPITAL_COMMUNITY): Payer: Self-pay

## 2022-11-13 ENCOUNTER — Encounter: Payer: Self-pay | Admitting: Student

## 2022-11-13 ENCOUNTER — Other Ambulatory Visit (HOSPITAL_COMMUNITY): Payer: Self-pay

## 2022-11-13 ENCOUNTER — Ambulatory Visit (INDEPENDENT_AMBULATORY_CARE_PROVIDER_SITE_OTHER): Payer: BLUE CROSS/BLUE SHIELD | Admitting: Student

## 2022-11-13 VITALS — BP 124/72 | HR 95 | Ht 71.0 in | Wt 226.0 lb

## 2022-11-13 DIAGNOSIS — E1169 Type 2 diabetes mellitus with other specified complication: Secondary | ICD-10-CM | POA: Diagnosis not present

## 2022-11-13 DIAGNOSIS — E785 Hyperlipidemia, unspecified: Secondary | ICD-10-CM

## 2022-11-13 DIAGNOSIS — E119 Type 2 diabetes mellitus without complications: Secondary | ICD-10-CM

## 2022-11-13 DIAGNOSIS — I10 Essential (primary) hypertension: Secondary | ICD-10-CM | POA: Diagnosis not present

## 2022-11-13 DIAGNOSIS — F419 Anxiety disorder, unspecified: Secondary | ICD-10-CM | POA: Diagnosis not present

## 2022-11-13 MED ORDER — SERTRALINE HCL 50 MG PO TABS
50.0000 mg | ORAL_TABLET | Freq: Every day | ORAL | 3 refills | Status: DC
  Filled 2022-11-13: qty 30, 30d supply, fill #0
  Filled 2022-12-09: qty 30, 30d supply, fill #1
  Filled 2023-01-06: qty 30, 30d supply, fill #2
  Filled 2023-02-03: qty 30, 30d supply, fill #3

## 2022-11-13 MED ORDER — ATORVASTATIN CALCIUM 40 MG PO TABS
40.0000 mg | ORAL_TABLET | Freq: Every day | ORAL | 2 refills | Status: DC
  Filled 2022-11-13: qty 30, 30d supply, fill #0
  Filled 2022-12-09: qty 30, 30d supply, fill #1
  Filled 2023-01-06 (×2): qty 30, 30d supply, fill #2

## 2022-11-13 MED ORDER — SEMAGLUTIDE(0.25 OR 0.5MG/DOS) 2 MG/1.5ML ~~LOC~~ SOPN: 0.2500 mg | PEN_INJECTOR | SUBCUTANEOUS | 3 refills | Status: DC

## 2022-11-13 NOTE — Progress Notes (Unsigned)
SUBJECTIVE:   CHIEF COMPLAINT / HPI:   Type 2 Diabetes: Home medications include: Ozempic weekly--last used his shot a couple of months ago but then ran out, metformin. Does endorse compliance.   Most recent A1Cs:  Lab Results  Component Value Date   HGBA1C 8.1 (A) 07/06/2022   HGBA1C 8.3 (H) 02/22/2022   Last Microalbumin, LDL, Creatinine: Lab Results  Component Value Date   LDLCALC 162 (H) 02/23/2022   CREATININE 1.16 11/13/2022    Hypertension: BP: 124/72 today. Home medications include: losartan 100 and added HCTZ 25 mg. He endorses taking these medications as prescribed.  Most recent creatinine trend:  Lab Results  Component Value Date   CREATININE 1.16 11/13/2022   CREATININE 0.93 03/12/2022   CREATININE 1.08 02/24/2022   Patient has had a BMP in the past 1 year.  H/o CVA: He was discharged on ASA 81 and plavix for 3 weeks then ASA monotherapy. Is only taking ASA now.   Anxiety -Has been going on for a long time -Worse when driving  -Builds up with bills and stressors at home  -Sleeping well  -When he is alone, the anxiety is worse -Never tried medications -No therapy  -not worse since last visit -no h/o depression  -has been creeping up on him, for about 10 years  -no mania  -Sometimes goes away with distractions   PERTINENT  PMH / PSH:   HTN  DM2  Patient Care Team: Alfredo Martinez, MD as PCP - General (Family Medicine) OBJECTIVE:  BP 124/72   Pulse 95   Ht 5\' 11"  (1.803 m)   Wt 226 lb (102.5 kg)   SpO2 100%   BMI 31.52 kg/m  Physical Exam  General: Alert and oriented in no apparent distress Heart: Regular rate and rhythm with no murmurs appreciated Lungs: CTA bilaterally, no wheezing Abdomen: Bowel sounds present, no abdominal pain Skin: Warm and dry Extremities: No lower extremity edema  ASSESSMENT/PLAN:  Hypertension, unspecified type Assessment & Plan: BP: 124/72 today. Well controlled. Continue to work on healthy dietary habits and  exercise. Follow up in 3 weeks.   Medication regimen: hydrochlorothiazide and losartan (make combo pill on next visit).    Orders: -     Microalbumin / creatinine urine ratio -     Basic metabolic panel -     Sertraline HCl; Take 1 tablet (50 mg total) by mouth daily.  Dispense: 30 tablet; Refill: 3 -     Ambulatory referral to Cardiology  Type 2 diabetes mellitus without complication, without long-term current use of insulin (HCC) Assessment & Plan:  - Last A1c:  Lab Results  Component Value Date   HGBA1C 8.1 (A) 07/06/2022   - Medications: Ozempic and metformin - on statin  - Wait for A1C - Could not void, obtain Uacr on next visit   Orders: -     Microalbumin / creatinine urine ratio -     Basic metabolic panel -     POCT glycosylated hemoglobin (Hb A1C)  Type 2 diabetes mellitus with other specified complication, without long-term current use of insulin (HCC) Assessment & Plan:  - Last A1c:  Lab Results  Component Value Date   HGBA1C 8.1 (A) 07/06/2022   - Medications: Ozempic and metformin - on statin  - Wait for A1C - Could not void, obtain Uacr on next visit   Orders: -     Semaglutide(0.25 or 0.5MG /DOS); Inject 0.25 mg into the skin once a week. 0.25 mg once weekly  for 4 weeks then increase to 0.5 mg weekly for at least 4 weeks,max 1 mg  Dispense: 3 mL; Refill: 3  Hyperlipidemia associated with type 2 diabetes mellitus (HCC) -     Atorvastatin Calcium; Take 1 tablet (40 mg total) by mouth daily.  Dispense: 30 tablet; Refill: 2  Anxious mood Assessment & Plan: Consider adding pharmacologic therapy at low dose and taper up if tolerated: Sertraline 50 mg Therapist options discussed and declined Patient without SI/HI; denies active plan and reports that they are safe to continue with outpatient treatment.  Close follow up 3 weeks   Orders: -     Sertraline HCl; Take 1 tablet (50 mg total) by mouth daily.  Dispense: 30 tablet; Refill: 3   Return in  about 3 weeks (around 12/04/2022) for Anxiety and HTN . Alfredo Martinez, MD 11/15/2022, 10:14 AM PGY-2, [facility]

## 2022-11-13 NOTE — Patient Instructions (Addendum)
It was great to see you today! Thank you for choosing [facility] for your primary care. Paul Dickerson was seen for follow up.  Today we addressed: Continue with your ASA Continue with the Ozempic and Metformin, lipitor I will add sertraline 50 mg once a day to your medication regimen I would like to see you in three weeks to discuss how you are doing  I will send you to the heart doctors as well, await their call   If you haven't already, sign up for My Chart to have easy access to your labs results, and communication with your primary care physician.  We are checking some labs today. If they are abnormal, I will call you. If they are normal, I will send you a MyChart message (if it is active) or a letter in the mail. If you do not hear about your labs in the next 2 weeks, please call the office. I recommend that you always bring your medications to each appointment as this makes it easy to ensure you are on the correct medications and helps [place] not miss refills when you need them. Call the clinic at [phone] if your symptoms worsen or you have any concerns.  You should return to our clinic Return in about 3 weeks (around 12/04/2022) for Anxiety and HTN . Please arrive 15 minutes before your appointment to ensure smooth check in process.  We appreciate your efforts in making this happen.  Thank you for allowing me to participate in your care, Alfredo Martinez, MD 11/13/2022, 3:47 PM PGY-2, [facility]

## 2022-11-15 DIAGNOSIS — F419 Anxiety disorder, unspecified: Secondary | ICD-10-CM | POA: Insufficient documentation

## 2022-11-15 NOTE — Assessment & Plan Note (Signed)
BP: 124/72 today. Well controlled. Continue to work on healthy dietary habits and exercise. Follow up in 3 weeks.   Medication regimen: hydrochlorothiazide and losartan (make combo pill on next visit).

## 2022-11-15 NOTE — Assessment & Plan Note (Signed)
Consider adding pharmacologic therapy at low dose and taper up if tolerated: Sertraline 50 mg Therapist options discussed and declined Patient without SI/HI; denies active plan and reports that they are safe to continue with outpatient treatment.  Close follow up 3 weeks

## 2022-11-15 NOTE — Assessment & Plan Note (Signed)
-   Last A1c:  Lab Results  Component Value Date   HGBA1C 8.1 (A) 07/06/2022   - Medications: Ozempic and metformin - on statin  - Wait for A1C - Could not void, obtain Uacr on next visit

## 2022-11-16 ENCOUNTER — Telehealth: Payer: Self-pay

## 2022-11-16 NOTE — Telephone Encounter (Signed)
A Prior Authorization was initiated for this patients OZEMPIC through CoverMyMeds.   Key: [id number]

## 2022-11-18 ENCOUNTER — Encounter: Payer: Self-pay | Admitting: Student

## 2022-11-18 LAB — BASIC METABOLIC PANEL
BUN/Creatinine Ratio: 12 (ref 10–24)
BUN: 14 mg/dL (ref 8–27)
CO2: 23 mmol/L (ref 20–29)
Calcium: 10 mg/dL (ref 8.6–10.2)
Chloride: 99 mmol/L (ref 96–106)
Creatinine, Ser: 1.16 mg/dL (ref 0.76–1.27)
Glucose: 154 mg/dL — ABNORMAL HIGH (ref 70–99)
Potassium: 4.5 mmol/L (ref 3.5–5.2)
Sodium: 137 mmol/L (ref 134–144)
eGFR: 72 mL/min/{1.73_m2} (ref >59)

## 2022-11-18 LAB — MICROALBUMIN / CREATININE URINE RATIO

## 2022-11-18 NOTE — Telephone Encounter (Signed)
Prior Auth for patients medication OZEMPIC approved by [facility] [employer] from 11/16/22 to 11/16/23.  CoverMyMeds Key: [id number]

## 2022-11-21 ENCOUNTER — Encounter: Payer: Self-pay | Admitting: Student

## 2022-12-09 ENCOUNTER — Other Ambulatory Visit: Payer: Self-pay | Admitting: Student

## 2022-12-09 ENCOUNTER — Other Ambulatory Visit (HOSPITAL_COMMUNITY): Payer: Self-pay

## 2022-12-09 DIAGNOSIS — I10 Essential (primary) hypertension: Secondary | ICD-10-CM

## 2023-01-06 ENCOUNTER — Other Ambulatory Visit (HOSPITAL_COMMUNITY): Payer: Self-pay

## 2023-01-19 ENCOUNTER — Encounter: Payer: Self-pay | Admitting: Internal Medicine

## 2023-01-19 ENCOUNTER — Ambulatory Visit: Payer: BLUE CROSS/BLUE SHIELD | Attending: Internal Medicine | Admitting: Internal Medicine

## 2023-01-19 VITALS — BP 124/78 | HR 75 | Ht 71.0 in | Wt 230.2 lb

## 2023-01-19 DIAGNOSIS — I509 Heart failure, unspecified: Secondary | ICD-10-CM

## 2023-01-19 DIAGNOSIS — I10 Essential (primary) hypertension: Secondary | ICD-10-CM | POA: Diagnosis not present

## 2023-01-19 DIAGNOSIS — I2510 Atherosclerotic heart disease of native coronary artery without angina pectoris: Secondary | ICD-10-CM | POA: Insufficient documentation

## 2023-01-19 DIAGNOSIS — Z789 Other specified health status: Secondary | ICD-10-CM | POA: Diagnosis not present

## 2023-01-19 DIAGNOSIS — I38 Endocarditis, valve unspecified: Secondary | ICD-10-CM | POA: Insufficient documentation

## 2023-01-19 NOTE — Progress Notes (Signed)
Cardiology Office Note:  .    Date:  01/19/2023  ID:  Paul Dickerson, DOB [birth year], MRN [id number] PCP: Alfredo Martinez, MD  [facility] Providers Cardiologist:  None     CC: Told to come Consulted for the evaluation of HF at the behest of Dr. Jena Gauss  History of Present Illness: .    Paul Dickerson is a 62 y.o. male  HTN with DM, Prior alcohol abuse. RBBB. Notes he had a prior heart attack in 2007.  He has prior stents.  He does not know what location; says it was done here.    Patient notes that he is feeling great.   He works delivering prescriptions for pharmacy.. Able to walk up stairs with no problems.    Has had no chest pain, chest pressure, chest tightness, chest stinging . Patient exertion notable for walking and doing delivery  and feels no symptoms.   He has chronic R knee swelling No shortness of breath, DOE .  No PND or orthopnea.  No weight gain, or abdominal swelling.  No syncope or near syncope . Notes  no palpitations or funny heart beats.     Patient reports prior cardiac testing including  2023: echo mild-ventricular function  Current beer drinking; 4-5 beers a day. He is amenable to weaning this done.  Drink's Miller lite  Relevant histories: .  Social: Alcohol use ROS: As per HPI.   Studies Reviewed: .   Cardiac Studies & Procedures       ECHOCARDIOGRAM  ECHOCARDIOGRAM COMPLETE 02/23/2022  Narrative ECHOCARDIOGRAM REPORT    Patient Name:   Paul Dickerson Date of Exam: 02/23/2022 Medical Rec #:  [id number]         Height:       71.0 in Accession #:    [id number]        Weight:       220.0 lb Date of Birth:  [birth year]         BSA:          2.196 m Patient Age:    61 years          BP:           173/95 mmHg Patient Gender: M                 HR:           74 bpm. Exam Location:  Inpatient  Procedure: 2D Echo, Cardiac Doppler, Color Doppler and Intracardiac Opacification Agent  Indications:    Stroke I63.9  History:         Patient has no prior history of Echocardiogram examinations. Stroke; Risk Factors:ETOH and Hypertension.  Sonographer:    Eulah Pont RDCS Referring Phys: [id number] DAN FLOYD  IMPRESSIONS   1. Left ventricular ejection fraction, by estimation, is 45 to 50%. Left ventricular ejection fraction by 2D MOD biplane is 48.5 %. The left ventricle has mildly decreased function. The left ventricle demonstrates regional wall motion abnormalities (see scoring diagram/findings for description). There is mild left ventricular hypertrophy. Left ventricular diastolic parameters are consistent with Grade I diastolic dysfunction (impaired relaxation). There is moderate hypokinesis of the left ventricular, basal inferior wall and inferolateral wall. 2. Right ventricular systolic function is mildly reduced. The right ventricular size is normal. 3. A small pericardial effusion is present. The pericardial effusion is posterior to the left ventricle. 4. The mitral valve is abnormal. Mild mitral valve regurgitation. 5. The aortic valve is tricuspid. Aortic  valve regurgitation is not visualized. 6. Aortic dilatation noted. There is borderline dilatation of the aortic root, measuring 38 mm. 7. The inferior vena cava is normal in size with greater than 50% respiratory variability, suggesting right atrial pressure of 3 mmHg.  Comparison(s): No prior Echocardiogram.  FINDINGS Left Ventricle: Left ventricular ejection fraction, by estimation, is 45 to 50%. Left ventricular ejection fraction by 2D MOD biplane is 48.5 %. The left ventricle has mildly decreased function. The left ventricle demonstrates regional wall motion abnormalities. Moderate hypokinesis of the left ventricular, basal inferior wall and inferolateral wall. Definity contrast agent was given IV to delineate the left ventricular endocardial borders. The left ventricular internal cavity size was normal in size. There is mild left ventricular hypertrophy.  Left ventricular diastolic parameters are consistent with Grade I diastolic dysfunction (impaired relaxation). Indeterminate filling pressures.  Right Ventricle: The right ventricular size is normal. No increase in right ventricular wall thickness. Right ventricular systolic function is mildly reduced.  Left Atrium: Left atrial size was normal in size.  Right Atrium: Right atrial size was normal in size.  Pericardium: A small pericardial effusion is present. The pericardial effusion is posterior to the left ventricle.  Mitral Valve: The mitral valve is abnormal. Mild mitral annular calcification. Mild mitral valve regurgitation.  Tricuspid Valve: The tricuspid valve is grossly normal. Tricuspid valve regurgitation is trivial.  Aortic Valve: The aortic valve is tricuspid. Aortic valve regurgitation is not visualized.  Pulmonic Valve: The pulmonic valve was grossly normal. Pulmonic valve regurgitation is trivial.  Aorta: Aortic dilatation noted. There is borderline dilatation of the aortic root, measuring 38 mm.  Venous: The inferior vena cava is normal in size with greater than 50% respiratory variability, suggesting right atrial pressure of 3 mmHg.  IAS/Shunts: No atrial level shunt detected by color flow Doppler.   LEFT VENTRICLE PLAX 2D                        Biplane EF (MOD) LVIDd:         4.80 cm         LV Biplane EF:   Left LVIDs:         3.80 cm                          ventricular LV PW:         1.00 cm                          ejection LV IVS:        1.30 cm                          fraction by LVOT diam:     2.20 cm                          2D MOD LV SV:         63                               biplane is LV SV Index:   29                               48.5 %. LVOT  Area:     3.80 cm Diastology LV e' medial:    4.73 cm/s LV Volumes (MOD)               LV E/e' medial:  12.9 LV vol d, MOD    61.5 ml       LV e' lateral:   6.23 cm/s A2C:                           LV E/e'  lateral: 9.8 LV vol d, MOD    103.0 ml A4C: LV vol s, MOD    30.7 ml A2C: LV vol s, MOD    54.0 ml A4C: LV SV MOD A2C:   30.8 ml LV SV MOD A4C:   103.0 ml LV SV MOD BP:    40.4 ml  RIGHT VENTRICLE RV S prime:     7.58 cm/s TAPSE (M-mode): 2.6 cm  LEFT ATRIUM             Index        RIGHT ATRIUM           Index LA diam:        4.00 cm 1.82 cm/m   RA Area:     16.80 cm LA Vol (A2C):   66.6 ml 30.33 ml/m  RA Volume:   43.40 ml  19.77 ml/m LA Vol (A4C):   56.9 ml 25.91 ml/m LA Biplane Vol: 62.6 ml 28.51 ml/m AORTIC VALVE LVOT Vmax:   87.90 cm/s LVOT Vmean:  54.100 cm/s LVOT VTI:    0.167 m  AORTA Ao Root diam: 3.80 cm Ao Asc diam:  3.60 cm  MITRAL VALVE MV Area (PHT): 5.13 cm    SHUNTS MV Decel Time: 148 msec    Systemic VTI:  0.17 m MV E velocity: 61.00 cm/s  Systemic Diam: 2.20 cm MV A velocity: 68.30 cm/s MV E/A ratio:  0.89  Zoila Shutter MD Electronically signed by Zoila Shutter MD Signature Date/Time: 02/23/2022/1:23:54 PM    Final             Physical Exam:    VS:  BP 124/78   Pulse 75   Ht 5\' 11"  (1.803 m)   Wt 230 lb 3.2 oz (104.4 kg)   SpO2 98%   BMI 32.11 kg/m    Wt Readings from Last 3 Encounters:  01/19/23 230 lb 3.2 oz (104.4 kg)  11/13/22 226 lb (102.5 kg)  07/06/22 231 lb 6 oz (105 kg)    Gen: no distress  Neck: No JVD Cardiac: No Rubs or Gallops, soft systolic murmur, RR +2 radial pulses Respiratory: Clear to auscultation bilaterally, normal effort, normal  respiratory rate GI: Soft, nontender, non-distended  MS: No  edema;  moves all extremities Integument: Skin feels warm Neuro:  At time of evaluation, alert and oriented to person/place/time/situation  Psych: Normal affect, patient feels ok   ASSESSMENT AND PLAN: .     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 [place], [address] [place], [place] [address] [phone]  11:51 AM

## 2023-01-19 NOTE — Patient Instructions (Addendum)
Medication Instructions:  Your physician recommends that you continue on your current medications as directed. Please refer to the Current Medication list given to you today.  *If you need a refill on your cardiac medications before your next appointment, please call your pharmacy*   Lab Work: FLP, Lpa, ALT, Ha1c  If you have labs (blood work) drawn today and your tests are completely normal, you will receive your results only by: MyChart Message (if you have MyChart) OR A paper copy in the mail If you have any lab test that is abnormal or we need to change your treatment, we will call you to review the results.   Testing/Procedures: Your physician has requested that you have a Cardiac PET Scan.    Follow-Up: At [facility], you and your health needs are our priority.  As part of our continuing mission to provide you with exceptional heart care, we have created designated Provider Care Teams.  These Care Teams include your primary Cardiologist (physician) and Advanced Practice Providers (APPs -  Physician Assistants and Nurse Practitioners) who all work together to provide you with the care you need, when you need it.  We recommend signing up for the patient portal called "MyChart".  Sign up information is provided on this After Visit Summary.  MyChart is used to connect with patients for Virtual Visits (Telemedicine).  Patients are able to view lab/test results, encounter notes, upcoming appointments, etc.  Non-urgent messages can be sent to your provider as well.   To learn more about what you can do with MyChart, go to [email or web address].    Your next appointment:   4 month(s)  Provider:   Riley Lam, MD  or Jari Favre, PA-C, Ronie Spies, PA-C, Robin Searing, NP, Jacolyn Reedy, PA-C, Eligha Bridegroom, NP, Tereso Newcomer, PA-C, or Perlie Gold, PA-C       Other Instructions How to Prepare for Your Cardiac PET/CT Stress Test:  1. Please do not take these  medications before your test:   Medications that may interfere with the cardiac pharmacological stress agent (ex. nitrates - including erectile dysfunction medications, isosorbide mononitrate, tamulosin or beta-blockers) the day of the exam. (Erectile dysfunction medication should be held for at least 72 hrs prior to test) Theophylline containing medications for 12 hours. Dipyridamole 48 hours prior to the test. Your remaining medications may be taken with water.  2. Nothing to eat or drink, except water, 3 hours prior to arrival time.   NO caffeine/decaffeinated products, or chocolate 12 hours prior to arrival.  3. NO perfume, cologne or lotion  4. Total time is 1 to 2 hours; you may want to bring reading material for the waiting time.  5. Please report to Radiology at the [facility] Main Entrance 30 minutes early for your test.  [address] [place], [place] [address]  6. Please report to Radiology at [facility] Main Entrance, medical mall, 30 mins prior to your test.  [address]  [place], [place]  [phone]  Diabetic Preparation:  Hold oral medications. You may take NPH and Lantus insulin. Do not take Humalog or Humulin R (Regular Insulin) the day of your test. Check blood sugars prior to leaving the house. If able to eat breakfast prior to 3 hour fasting, you may take all medications, including your insulin, Do not worry if you miss your breakfast dose of insulin - start at your next meal.  IF YOU THINK YOU MAY BE PREGNANT, OR ARE  NURSING PLEASE INFORM THE TECHNOLOGIST.  In preparation for your appointment, medication and supplies will be purchased.  Appointment availability is limited, so if you need to cancel or reschedule, please call the Radiology Department at [phone] [facility]) OR [phone] [facility])  24 hours in advance to avoid a cancellation fee of $100.00  What to Expect After you Arrive:  Once you arrive and  check in for your appointment, you will be taken to a preparation room within the Radiology Department.  A technologist or Nurse will obtain your medical history, verify that you are correctly prepped for the exam, and explain the procedure.  Afterwards,  an IV will be started in your arm and electrodes will be placed on your skin for EKG monitoring during the stress portion of the exam. Then you will be escorted to the PET/CT scanner.  There, staff will get you positioned on the scanner and obtain a blood pressure and EKG.  During the exam, you will continue to be connected to the EKG and blood pressure machines.  A small, safe amount of a radioactive tracer will be injected in your IV to obtain a series of pictures of your heart along with an injection of a stress agent.    After your Exam:  It is recommended that you eat a meal and drink a caffeinated beverage to counter act any effects of the stress agent.  Drink plenty of fluids for the remainder of the day and urinate frequently for the first couple of hours after the exam.  Your doctor will inform you of your test results within 7-10 business days.  For more information and frequently asked questions, please visit our website : [email or web address]  For questions about your test or how to prepare for your test, please call: Cardiac Imaging Nurse Navigators Office: [phone]

## 2023-01-20 LAB — HEMOGLOBIN A1C
Est. average glucose Bld gHb Est-mCnc: 171 mg/dL
Hgb A1c MFr Bld: 7.6 % — ABNORMAL HIGH (ref 4.8–5.6)

## 2023-01-20 LAB — LIPID PANEL
Chol/HDL Ratio: 2.8 ratio (ref 0.0–5.0)
Cholesterol, Total: 177 mg/dL (ref 100–199)
HDL: 64 mg/dL (ref >39)
LDL Chol Calc (NIH): 101 mg/dL — ABNORMAL HIGH (ref 0–99)
Triglycerides: 61 mg/dL (ref 0–149)
VLDL Cholesterol Cal: 12 mg/dL (ref 5–40)

## 2023-01-20 LAB — LIPOPROTEIN A (LPA): Lipoprotein (a): 99.1 nmol/L — ABNORMAL HIGH (ref <75.0)

## 2023-02-03 ENCOUNTER — Other Ambulatory Visit: Payer: Self-pay | Admitting: Student

## 2023-02-03 ENCOUNTER — Other Ambulatory Visit (HOSPITAL_COMMUNITY): Payer: Self-pay

## 2023-02-03 DIAGNOSIS — E1169 Type 2 diabetes mellitus with other specified complication: Secondary | ICD-10-CM

## 2023-02-03 DIAGNOSIS — I10 Essential (primary) hypertension: Secondary | ICD-10-CM

## 2023-02-03 DIAGNOSIS — E119 Type 2 diabetes mellitus without complications: Secondary | ICD-10-CM

## 2023-02-03 MED ORDER — ATORVASTATIN CALCIUM 40 MG PO TABS
40.0000 mg | ORAL_TABLET | Freq: Every day | ORAL | 2 refills | Status: DC
  Filled 2023-02-03: qty 30, 30d supply, fill #0

## 2023-02-05 ENCOUNTER — Other Ambulatory Visit (HOSPITAL_COMMUNITY): Payer: Self-pay

## 2023-02-05 ENCOUNTER — Telehealth: Payer: Self-pay

## 2023-02-05 DIAGNOSIS — I38 Endocarditis, valve unspecified: Secondary | ICD-10-CM

## 2023-02-05 DIAGNOSIS — I2510 Atherosclerotic heart disease of native coronary artery without angina pectoris: Secondary | ICD-10-CM

## 2023-02-05 MED ORDER — DAPAGLIFLOZIN PROPANEDIOL 10 MG PO TABS
10.0000 mg | ORAL_TABLET | Freq: Every day | ORAL | 3 refills | Status: DC
  Filled 2023-02-05: qty 90, 90d supply, fill #0

## 2023-02-05 MED ORDER — ATORVASTATIN CALCIUM 80 MG PO TABS
80.0000 mg | ORAL_TABLET | Freq: Every day | ORAL | 3 refills | Status: DC
  Filled 2023-02-05: qty 90, 90d supply, fill #0
  Filled 2023-04-28: qty 90, 90d supply, fill #1
  Filled 2023-07-21: qty 90, 90d supply, fill #2
  Filled 2023-10-28: qty 90, 90d supply, fill #3

## 2023-02-05 MED ORDER — DAPAGLIFLOZIN PROPANEDIOL 10 MG PO TABS: 10.0000 mg | ORAL_TABLET | Freq: Every day | ORAL | 0 refills | Status: DC

## 2023-02-05 NOTE — Telephone Encounter (Signed)
The patient has been notified of the result and verbalized understanding.  All questions (if any) were answered. Paul Right Jarad Barth, RN 02/05/2023 9:10 AM    Advised pt of good peri care d/t starting Farxiga.  Pt will come in for f/u labs on 02/22/23.  Will come in for f/u fasting labs on 05/14/23.  28 days of Farxiga 10 mg samples, 30 day free trial offer, information for pt assistance and appointment reminders placed at front desk for pt pick up.   LOT [id number] EXP 2025-03-31- Farxiga.

## 2023-02-05 NOTE — Telephone Encounter (Signed)
-----   Message from [name] sent at 01/22/2023 10:02 AM EDT ----- Results: LDL is above goal for CAD Lp(a) elevated A1c has improved by is above goal Plan: Increase atorvastatin to 80 mg, Lipids and full LFTs in three months Start Farxiga 10 mg, and BMP in two weeks.  [name], MD

## 2023-02-06 ENCOUNTER — Other Ambulatory Visit: Payer: Self-pay | Admitting: Student

## 2023-02-06 DIAGNOSIS — I10 Essential (primary) hypertension: Secondary | ICD-10-CM

## 2023-02-15 ENCOUNTER — Other Ambulatory Visit (HOSPITAL_COMMUNITY): Payer: Self-pay

## 2023-02-16 ENCOUNTER — Telehealth: Payer: Self-pay

## 2023-02-16 ENCOUNTER — Other Ambulatory Visit (HOSPITAL_COMMUNITY): Payer: Self-pay

## 2023-02-16 NOTE — Telephone Encounter (Signed)
-----   Message from [name] sent at 02/08/2023  8:22 AM EDT ----- Regarding: RE: AUTH DENIAL Clinical question is to evaluate etiology of heart failure. - recommend PET as most accurate and would also assess his LVEF. - if insurance will not cover this, we can to a cardiac CT for ischemic assessment for congestive heart failure  Riley Lam, MD FASE [facility] Cardiologist [facility]  [address] [place], [address] [place], [place] [address] [phone]  8:23 AM ----- Message ----- From: Francine Graven Sent: 02/08/2023   8:12 AM EDT To: Lennie Odor, RN; Dorette Grate, RN; # Subject: Ollen Bowl                                    Good morning,   [employer] has denied auth for PET scan. Following are the details.   We denied your request for:  [address] - PET scan of heart to show how well blood flows through your heart muscle  and to show how well your heart muscle is pumping. The PET Scan is done while you  rest and the then while you are exercising Policy rules found at [facility] Cardiac Imaging Guidelines Section(s): Cardiac  PET - Perfusion - Indications (CD 6.2) guided our decision.  Here are the policy requirements your request did not meet: Your healthcare provider told us that there is a concern related to your heart. The request cannot  be approved because: Imaging can be done when one of these conditions apply to you. -Your body mass index is greater than 40. This is a number that measures body size based on  height and weight. -You have large breasts or implants. -You are unable to exercise enough to reach a target heart rate due to a physical problem. The  details sent to [place] do not describe one of these conditions. This finding was based on review of [employer] Cardiac Imaging Guidelines Section(s):  Cardiac PET - Perfusion - Indications (CD 6.2)    There are no peer to peer options at this time, just an appeal.   Thanks,   Hermine Messick

## 2023-02-16 NOTE — Telephone Encounter (Signed)
Pharmacy Patient Advocate Encounter   Received notification from CoverMyMeds that prior authorization for [facility] is required/requested.   Insurance verification completed.   The patient is insured through [facility] .   Per test claim: PA required; PA submitted to [facility] via CoverMyMeds Key/confirmation #/EOC [id number] Status is pending

## 2023-02-16 NOTE — Telephone Encounter (Signed)
Called pt advised of insurance denial for Cardiac PET Scan.  Advised will need Cardiac CT Scan instead.  Pt reports unable to review instructions at this time.  Will call back when has time to review instructions.   Instruction letter pending in letters tab for review.

## 2023-02-17 ENCOUNTER — Telehealth: Payer: Self-pay | Admitting: Internal Medicine

## 2023-02-17 DIAGNOSIS — I2510 Atherosclerotic heart disease of native coronary artery without angina pectoris: Secondary | ICD-10-CM

## 2023-02-17 DIAGNOSIS — I38 Endocarditis, valve unspecified: Secondary | ICD-10-CM

## 2023-02-17 NOTE — Addendum Note (Signed)
Addended by: Macie Burows on: 02/17/2023 03:23 PM   Modules accepted: Orders

## 2023-02-17 NOTE — Telephone Encounter (Signed)
Patient returned RN's call.

## 2023-02-17 NOTE — Telephone Encounter (Signed)
Called pt reviewed instructions for Cardiac CT.  Pt not able to get onto My Chart.  Will come by the office today or tomorrow to pick up instructions.  Advised to call into the office if he has questions or concerns.

## 2023-02-18 ENCOUNTER — Other Ambulatory Visit (HOSPITAL_COMMUNITY): Payer: Self-pay

## 2023-02-18 NOTE — Telephone Encounter (Signed)
Pharmacy Patient Advocate Encounter  Received notification from [facility] that Prior Authorization for Paul Dickerson has been APPROVED from 02/17/23 to 02/17/24. Ran test claim, Copay is $543 (PATIENT HAS HIGH DEDUCTIBLE TO MEET STILL). This test claim was processed through [facility]- copay amounts may vary at other pharmacies due to pharmacy/plan contracts, or as the patient moves through the different stages of their insurance plan.

## 2023-02-22 ENCOUNTER — Ambulatory Visit: Payer: BLUE CROSS/BLUE SHIELD | Attending: Internal Medicine

## 2023-02-22 DIAGNOSIS — I38 Endocarditis, valve unspecified: Secondary | ICD-10-CM

## 2023-02-22 LAB — BASIC METABOLIC PANEL
BUN/Creatinine Ratio: 9 — ABNORMAL LOW (ref 10–24)
BUN: 10 mg/dL (ref 8–27)
CO2: 25 mmol/L (ref 20–29)
Calcium: 10.3 mg/dL — ABNORMAL HIGH (ref 8.6–10.2)
Chloride: 96 mmol/L (ref 96–106)
Creatinine, Ser: 1.15 mg/dL (ref 0.76–1.27)
Glucose: 144 mg/dL — ABNORMAL HIGH (ref 70–99)
Potassium: 4.1 mmol/L (ref 3.5–5.2)
Sodium: 137 mmol/L (ref 134–144)
eGFR: 72 mL/min/{1.73_m2} (ref >59)

## 2023-02-24 NOTE — Telephone Encounter (Signed)
Please see encounter from 02/17/23 reviewed Cardiac CT instructions.

## 2023-03-03 ENCOUNTER — Other Ambulatory Visit: Payer: Self-pay | Admitting: Student

## 2023-03-03 ENCOUNTER — Other Ambulatory Visit (HOSPITAL_COMMUNITY): Payer: Self-pay

## 2023-03-03 ENCOUNTER — Other Ambulatory Visit: Payer: Self-pay

## 2023-03-03 DIAGNOSIS — I10 Essential (primary) hypertension: Secondary | ICD-10-CM

## 2023-03-03 DIAGNOSIS — F419 Anxiety disorder, unspecified: Secondary | ICD-10-CM

## 2023-03-03 MED ORDER — SERTRALINE HCL 50 MG PO TABS
50.0000 mg | ORAL_TABLET | Freq: Every day | ORAL | 3 refills | Status: DC
  Filled 2023-03-03: qty 30, 30d supply, fill #0
  Filled 2023-04-19: qty 90, 90d supply, fill #1

## 2023-03-03 MED ORDER — ASPIRIN 81 MG PO TBEC
81.0000 mg | DELAYED_RELEASE_TABLET | Freq: Every day | ORAL | 2 refills | Status: DC
  Filled 2023-03-03: qty 150, 150d supply, fill #0
  Filled 2023-07-21: qty 150, 150d supply, fill #1
  Filled 2023-12-19: qty 150, 150d supply, fill #2

## 2023-03-05 ENCOUNTER — Other Ambulatory Visit (HOSPITAL_COMMUNITY): Payer: Self-pay

## 2023-03-25 ENCOUNTER — Other Ambulatory Visit: Payer: Self-pay | Admitting: Student

## 2023-03-25 DIAGNOSIS — I10 Essential (primary) hypertension: Secondary | ICD-10-CM

## 2023-03-26 ENCOUNTER — Telehealth: Payer: Self-pay

## 2023-03-26 NOTE — Progress Notes (Signed)
Attempted to reach patient. No answer. To make lab visit only to repeat BMP. Unable to LVM due to mailbox not being set up. Will try again. Aquilla Solian, CMA

## 2023-03-26 NOTE — Telephone Encounter (Signed)
Patient calls nurse line reporting he missed a call from "someone."  I see where patient has a future BMP placed as of yesterday.   Will forward to PCP to see if he needs a lab only visit.

## 2023-03-29 ENCOUNTER — Other Ambulatory Visit: Payer: BLUE CROSS/BLUE SHIELD

## 2023-03-29 ENCOUNTER — Other Ambulatory Visit: Payer: Self-pay | Admitting: *Deleted

## 2023-03-29 DIAGNOSIS — I10 Essential (primary) hypertension: Secondary | ICD-10-CM

## 2023-03-29 NOTE — Telephone Encounter (Signed)
Patient had a lab visit this AM.

## 2023-03-30 LAB — BASIC METABOLIC PANEL
BUN/Creatinine Ratio: 11 (ref 10–24)
BUN: 11 mg/dL (ref 8–27)
CO2: 28 mmol/L (ref 20–29)
Calcium: 10.3 mg/dL — ABNORMAL HIGH (ref 8.6–10.2)
Chloride: 100 mmol/L (ref 96–106)
Creatinine, Ser: 1 mg/dL (ref 0.76–1.27)
Glucose: 140 mg/dL — ABNORMAL HIGH (ref 70–99)
Potassium: 5.2 mmol/L (ref 3.5–5.2)
Sodium: 141 mmol/L (ref 134–144)
eGFR: 86 mL/min/{1.73_m2} (ref >59)

## 2023-04-05 ENCOUNTER — Encounter: Payer: Self-pay | Admitting: Student

## 2023-04-05 NOTE — Progress Notes (Signed)
Elevated Cal on hydrochlorothiazide, may need to change BP medication. Sent message and letter outlining this plan. Slight elevation on last check to 10.3. instructed to call clinic and schedule follow up to further discuss.

## 2023-04-19 ENCOUNTER — Other Ambulatory Visit (HOSPITAL_COMMUNITY): Payer: Self-pay

## 2023-05-10 ENCOUNTER — Other Ambulatory Visit: Payer: Self-pay | Admitting: Student

## 2023-05-11 ENCOUNTER — Encounter (HOSPITAL_COMMUNITY): Payer: Self-pay

## 2023-05-14 ENCOUNTER — Ambulatory Visit: Payer: BLUE CROSS/BLUE SHIELD

## 2023-05-14 DIAGNOSIS — I38 Endocarditis, valve unspecified: Secondary | ICD-10-CM

## 2023-05-14 DIAGNOSIS — I2510 Atherosclerotic heart disease of native coronary artery without angina pectoris: Secondary | ICD-10-CM

## 2023-05-17 ENCOUNTER — Ambulatory Visit: Payer: Medicaid Other | Admitting: Internal Medicine

## 2023-05-31 ENCOUNTER — Other Ambulatory Visit: Payer: Self-pay | Admitting: Student

## 2023-05-31 DIAGNOSIS — I10 Essential (primary) hypertension: Secondary | ICD-10-CM

## 2023-07-21 ENCOUNTER — Other Ambulatory Visit (HOSPITAL_COMMUNITY): Payer: Self-pay

## 2023-07-21 ENCOUNTER — Other Ambulatory Visit: Payer: Self-pay

## 2023-07-21 ENCOUNTER — Other Ambulatory Visit: Payer: Self-pay | Admitting: Student

## 2023-07-21 DIAGNOSIS — F419 Anxiety disorder, unspecified: Secondary | ICD-10-CM

## 2023-07-21 DIAGNOSIS — I10 Essential (primary) hypertension: Secondary | ICD-10-CM

## 2023-07-22 ENCOUNTER — Other Ambulatory Visit (HOSPITAL_COMMUNITY): Payer: Self-pay

## 2023-07-22 MED ORDER — SERTRALINE HCL 50 MG PO TABS
50.0000 mg | ORAL_TABLET | Freq: Every day | ORAL | 3 refills | Status: DC
  Filled 2023-07-22: qty 30, 30d supply, fill #0
  Filled 2023-08-20: qty 30, 30d supply, fill #1

## 2023-07-23 ENCOUNTER — Other Ambulatory Visit: Payer: Self-pay | Admitting: Student

## 2023-07-23 DIAGNOSIS — E119 Type 2 diabetes mellitus without complications: Secondary | ICD-10-CM

## 2023-07-26 ENCOUNTER — Ambulatory Visit: Payer: Medicaid Other | Attending: Internal Medicine | Admitting: Internal Medicine

## 2023-07-26 ENCOUNTER — Other Ambulatory Visit (HOSPITAL_COMMUNITY): Payer: Self-pay

## 2023-07-26 ENCOUNTER — Other Ambulatory Visit (HOSPITAL_BASED_OUTPATIENT_CLINIC_OR_DEPARTMENT_OTHER): Payer: Self-pay

## 2023-07-26 VITALS — BP 162/82 | HR 74 | Ht 71.0 in | Wt 229.0 lb

## 2023-07-26 DIAGNOSIS — I10 Essential (primary) hypertension: Secondary | ICD-10-CM

## 2023-07-26 DIAGNOSIS — I2510 Atherosclerotic heart disease of native coronary artery without angina pectoris: Secondary | ICD-10-CM | POA: Diagnosis not present

## 2023-07-26 DIAGNOSIS — I5042 Chronic combined systolic (congestive) and diastolic (congestive) heart failure: Secondary | ICD-10-CM | POA: Diagnosis not present

## 2023-07-26 DIAGNOSIS — I38 Endocarditis, valve unspecified: Secondary | ICD-10-CM | POA: Diagnosis not present

## 2023-07-26 MED ORDER — METOPROLOL TARTRATE 100 MG PO TABS: ORAL_TABLET | ORAL | 0 refills | Status: DC

## 2023-07-26 MED ORDER — OMRON 3 SERIES BP MONITOR DEVI
0 refills | Status: DC
  Filled 2023-07-26: qty 1, 30d supply, fill #0

## 2023-07-26 NOTE — Progress Notes (Signed)
 Cardiology Office Note:  .    Date:  07/26/2023  ID:  Paul Dickerson, DOB 04/14/1961, MRN [id number] PCP: Alfredo Martinez, MD  [facility] Providers Cardiologist:  None     CC: Follow up  History of Present Illness: .    Discussed the use of AI scribe software for clinical note transcription with the patient, who gave verbal consent to proceed.  Paul Dickerson is a 63 year old male with hypertension, diabetes, and heart failure with mildly reduced ejection fraction who presents for follow-up on his cardiac condition and blood pressure management.  He has a history of heart failure with mildly reduced ejection fraction. A cardiac CT was ordered to evaluate for ischemic disease, but it has not been completed due to insurance issues. No chest pain, shortness of breath, leg swelling, or weight gain. He feels 'pretty good' overall.  His blood pressure is notably elevated today, although it was not this high during previous visits. He is currently on aspirin, an SGLT2 inhibitor, Norvasc, losartan, and hydrochlorothiazide. He does not monitor his blood pressure at home and notes that his blood pressure tends to rise during clinic visits.  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 He consumes about four [employer] daily, consistent with his previous intake, and acknowledges the need to reduce his alcohol consumption.  Did not get stress testing   Relevant histories: .  Social - loves Hyacinth Meeker Lite 2024: Established with MAC, started on SGLT2i ROS: As per HPI.   Studies Reviewed: .   Cardiac Studies & Procedures   ______________________________________________________________________________________________     ECHOCARDIOGRAM  ECHOCARDIOGRAM COMPLETE 02/23/2022  Narrative ECHOCARDIOGRAM REPORT    Patient Name:   Paul Dickerson Date of Exam: 02/23/2022 Medical Rec #:  [id number]         Height:       71.0 in Accession #:     [id number]        Weight:       220.0 lb Date of Birth:  Jun 19, 1960         BSA:          2.196 m Patient Age:    61 years          BP:           173/95 mmHg Patient Gender: M                 HR:           74 bpm. Exam Location:  Inpatient  Procedure: 2D Echo, Cardiac Doppler, Color Doppler and Intracardiac Opacification Agent  Indications:    Stroke I63.9  History:        Patient has no prior history of Echocardiogram examinations. Stroke; Risk Factors:ETOH and Hypertension.  Sonographer:    Eulah Pont RDCS Referring Phys: [id number] DAN FLOYD  IMPRESSIONS   1. Left ventricular ejection fraction, by estimation, is 45 to 50%. Left ventricular ejection fraction by 2D MOD biplane is 48.5 %. The left ventricle has mildly decreased function. The left ventricle demonstrates regional wall motion abnormalities (see scoring diagram/findings for description). There is mild left ventricular hypertrophy. Left ventricular diastolic parameters are consistent with Grade I diastolic dysfunction (impaired relaxation). There is moderate hypokinesis of the left ventricular, basal inferior wall and inferolateral wall. 2. Right ventricular systolic function is mildly reduced. The right ventricular size is normal. 3. A small pericardial effusion is present. The pericardial effusion is posterior to  the left ventricle. 4. The mitral valve is abnormal. Mild mitral valve regurgitation. 5. The aortic valve is tricuspid. Aortic valve regurgitation is not visualized. 6. Aortic dilatation noted. There is borderline dilatation of the aortic root, measuring 38 mm. 7. The inferior vena cava is normal in size with greater than 50% respiratory variability, suggesting right atrial pressure of 3 mmHg.  Comparison(s): No prior Echocardiogram.  FINDINGS Left Ventricle: Left ventricular ejection fraction, by estimation, is 45 to 50%. Left ventricular ejection fraction by 2D MOD biplane is 48.5 %. The left ventricle has  mildly decreased function. The left ventricle demonstrates regional wall motion abnormalities. Moderate hypokinesis of the left ventricular, basal inferior wall and inferolateral wall. Definity contrast agent was given IV to delineate the left ventricular endocardial borders. The left ventricular internal cavity size was normal in size. There is mild left ventricular hypertrophy. Left ventricular diastolic parameters are consistent with Grade I diastolic dysfunction (impaired relaxation). Indeterminate filling pressures.  Right Ventricle: The right ventricular size is normal. No increase in right ventricular wall thickness. Right ventricular systolic function is mildly reduced.  Left Atrium: Left atrial size was normal in size.  Right Atrium: Right atrial size was normal in size.  Pericardium: A small pericardial effusion is present. The pericardial effusion is posterior to the left ventricle.  Mitral Valve: The mitral valve is abnormal. Mild mitral annular calcification. Mild mitral valve regurgitation.  Tricuspid Valve: The tricuspid valve is grossly normal. Tricuspid valve regurgitation is trivial.  Aortic Valve: The aortic valve is tricuspid. Aortic valve regurgitation is not visualized.  Pulmonic Valve: The pulmonic valve was grossly normal. Pulmonic valve regurgitation is trivial.  Aorta: Aortic dilatation noted. There is borderline dilatation of the aortic root, measuring 38 mm.  Venous: The inferior vena cava is normal in size with greater than 50% respiratory variability, suggesting right atrial pressure of 3 mmHg.  IAS/Shunts: No atrial level shunt detected by color flow Doppler.   LEFT VENTRICLE PLAX 2D                        Biplane EF (MOD) LVIDd:         4.80 cm         LV Biplane EF:   Left LVIDs:         3.80 cm                          ventricular LV PW:         1.00 cm                          ejection LV IVS:        1.30 cm                          fraction by LVOT  diam:     2.20 cm                          2D MOD LV SV:         63                               biplane is LV SV Index:   29  48.5 %. LVOT Area:     3.80 cm Diastology LV e' medial:    4.73 cm/s LV Volumes (MOD)               LV E/e' medial:  12.9 LV vol d, MOD    61.5 ml       LV e' lateral:   6.23 cm/s A2C:                           LV E/e' lateral: 9.8 LV vol d, MOD    103.0 ml A4C: LV vol s, MOD    30.7 ml A2C: LV vol s, MOD    54.0 ml A4C: LV SV MOD A2C:   30.8 ml LV SV MOD A4C:   103.0 ml LV SV MOD BP:    40.4 ml  RIGHT VENTRICLE RV S prime:     7.58 cm/s TAPSE (M-mode): 2.6 cm  LEFT ATRIUM             Index        RIGHT ATRIUM           Index LA diam:        4.00 cm 1.82 cm/m   RA Area:     16.80 cm LA Vol (A2C):   66.6 ml 30.33 ml/m  RA Volume:   43.40 ml  19.77 ml/m LA Vol (A4C):   56.9 ml 25.91 ml/m LA Biplane Vol: 62.6 ml 28.51 ml/m AORTIC VALVE LVOT Vmax:   87.90 cm/s LVOT Vmean:  54.100 cm/s LVOT VTI:    0.167 m  AORTA Ao Root diam: 3.80 cm Ao Asc diam:  3.60 cm  MITRAL VALVE MV Area (PHT): 5.13 cm    SHUNTS MV Decel Time: 148 msec    Systemic VTI:  0.17 m MV E velocity: 61.00 cm/s  Systemic Diam: 2.20 cm MV A velocity: 68.30 cm/s MV E/A ratio:  0.89  Zoila Shutter MD Electronically signed by Zoila Shutter MD Signature Date/Time: 02/23/2022/1:23:54 PM    Final          ______________________________________________________________________________________________      Physical Exam:    VS:  BP (!) 162/82 (BP Location: Left Arm)   Pulse 74   Ht 5\' 11"  (1.803 m)   Wt 229 lb (103.9 kg)   SpO2 98%   BMI 31.94 kg/m    Wt Readings from Last 3 Encounters:  07/26/23 229 lb (103.9 kg)  01/19/23 230 lb 3.2 oz (104.4 kg)  11/13/22 226 lb (102.5 kg)    Gen: no distress   HEENT: Poor dentition Cardiac: No Rubs or Gallops, no murmur, RRR +2 radial pulses Respiratory: Clear to auscultation bilaterally,  normal effort, normal  respiratory rate GI: Soft, nontender, non-distended  MS: No  edema;  moves all extremities Integument: Skin feels warm Neuro:  At time of evaluation, alert and oriented to person/place/time/situation  Psych: Normal affect, patient feels well   ASSESSMENT AND PLAN: .    Heart Failure with mildly reduced ejection fraction Diagnosed with heart failure with mildly reduced ejection fraction. No current symptoms of chest pain, dyspnea, or edema. Previous PET scan for ischemic evaluation not covered by insurance; cardiac CT ordered but not completed. Discussed the importance of ruling out ischemic disease to potentially improve heart function if a blockage is found and treated. - Ensure completion of cardiac CT to rule out ischemic disease - continue current medication for now  Hypertension Significantly elevated  blood pressure noted today. Managed with losartan and hydrochlorothiazide. Patient not monitoring blood pressure at home. Possible white coat hypertension considered. Discussed combining losartan and hydrochlorothiazide into a single pill if blood pressure remains elevated. Emphasized home blood pressure monitoring and potential use of an ambulatory blood pressure monitor if home monitoring is not feasible. - Provide blood pressure cuff for home monitoring - Order ambulatory blood pressure monitor if home monitoring is not feasible - Consider changing losartan and hydrochlorothiazide into Entresto if elevated  Hyperlipidemia Cholesterol levels slightly elevated.  Alcohol Use Continues to consume approximately four [employer] daily despite previous discussions about reducing intake. Advised that reducing alcohol consumption would improve overall health and longevity.  General Health Maintenance Discussed in the context of managing chronic conditions and lifestyle modifications. - Encourage regular monitoring of blood pressure at home - Advocate for  lifestyle modifications including reducing alcohol intake  Follow-up - Follow up after home blood pressure monitoring results are available - Schedule follow-up visit to review cardiac CT results.  Riley Lam, MD FASE [facility] Cardiologist [facility]  [address] [place], [address] [place], [place] [address] [phone]  11:18 AM

## 2023-07-26 NOTE — Patient Instructions (Addendum)
 Medication Instructions:  Your physician recommends that you continue on your current medications as directed. Please refer to the Current Medication list given to you today.  *If you need a refill on your cardiac medications before your next appointment, please call your pharmacy*   Lab Work: BMP  If you have labs (blood work) drawn today and your tests are completely normal, you will receive your results only by: MyChart Message (if you have MyChart) OR A paper copy in the mail If you have any lab test that is abnormal or we need to change your treatment, we will call you to review the results.   Testing/Procedures: Your physician has requested that you have cardiac CT. Cardiac computed tomography (CT) is a painless test that uses an x-ray machine to take clear, detailed pictures of your heart. For further information please visit [email or web address] Please follow instruction sheet as given.     Follow-Up: At [facility], you and your health needs are our priority.  As part of our continuing mission to provide you with exceptional heart care, we have created designated Provider Care Teams.  These Care Teams include your primary Cardiologist (physician) and Advanced Practice Providers (APPs -  Physician Assistants and Nurse Practitioners) who all work together to provide you with the care you need, when you need it.  We recommend signing up for the patient portal called "MyChart".  Sign up information is provided on this After Visit Summary.  MyChart is used to connect with patients for Virtual Visits (Telemedicine).  Patients are able to view lab/test results, encounter notes, upcoming appointments, etc.  Non-urgent messages can be sent to your provider as well.   To learn more about what you can do with MyChart, go to [email or web address].    Your next appointment:   6 month(s)  Provider:   Jari Favre, PA-C, Ronie Spies, PA-C, Robin Searing, NP, Jacolyn Reedy, PA-C,  Eligha Bridegroom, NP, Tereso Newcomer, PA-C, or Perlie Gold, PA-C       Other Instructions   Your cardiac CT will be scheduled at one of the below locations:   [facility] [address] [place], [place] [address] [phone]  OR  [facility] [address] [place], [place] [address] [phone]  OR   [facility] [address] [place], [place] [address] [phone]  OR   [facility] [address] [place], [place] [address] [phone]  If scheduled at [facility], please arrive at the [facility] and Children's Entrance (Entrance C2) of [facility] 30 minutes prior to test start time. You can use the FREE valet parking offered at entrance C (encouraged to control the heart rate for the test)  Proceed to the [facility] Radiology Department (first floor) to check-in and test prep.  All radiology patients and guests should use entrance C2 at [facility], accessed from [facility], even though the hospital's physical address listed is [address].    If scheduled at [facility] or [facility], please arrive 15 mins early for check-in and test prep.  There is spacious parking and easy access to the radiology department from the [facility] entrance. Please enter here and check-in with the desk attendant.   If scheduled at [facility], please arrive 30 minutes early for check-in and test prep.  Please follow these instructions carefully (unless otherwise directed):  An IV will be required for this test and Nitroglycerin will be given.  Hold all erectile dysfunction medications at least 3 days (72 hrs) prior to test. (Ie viagra, cialis, sildenafil, tadalafil, etc)   On the Night Before the Test: Be sure to Drink plenty of water. Do not consume any  caffeinated/decaffeinated beverages or chocolate 12 hours prior to your test. Do not take any antihistamines 12 hours prior to your test.  On the Day of the Test: Drink plenty of water until 1 hour prior to the test. Do not eat any food 1 hour prior to test. You may take your regular medications prior to the test.  Take metoprolol (Lopressor) 100 mg by mouth two hours prior to test. If you take Furosemide/Hydrochlorothiazide/Spironolactone/Chlorthalidone, please HOLD on the morning of the test. Patients who wear a continuous glucose monitor MUST remove the device prior to scanning.       After the Test: Drink plenty of water. After receiving IV contrast, you may experience a mild flushed feeling. This is normal. On occasion, you may experience a mild rash up to 24 hours after the test. This is not dangerous. If this occurs, you can take Benadryl 25 mg, Zyrtec, Claritin, or Allegra and increase your fluid intake. (Patients taking Tikosyn should avoid Benadryl, and may take Zyrtec, Claritin, or Allegra) If you experience trouble breathing, this can be serious. If it is severe call 911 IMMEDIATELY. If it is mild, please call our office.  We will call to schedule your test 2-4 weeks out understanding that some insurance companies will need an authorization prior to the service being performed.   For more information and frequently asked questions, please visit our website : [email or web address]  For non-scheduling related questions, please contact the cardiac imaging nurse navigator should you have any questions/concerns: Cardiac Imaging Nurse Navigators Direct Office Dial: [phone]   For scheduling needs, including cancellations and rescheduling, please call [place], [phone].

## 2023-07-28 LAB — BASIC METABOLIC PANEL
BUN/Creatinine Ratio: 13 (ref 10–24)
BUN: 16 mg/dL (ref 8–27)
CO2: 22 mmol/L (ref 20–29)
Calcium: 10.1 mg/dL (ref 8.6–10.2)
Chloride: 99 mmol/L (ref 96–106)
Creatinine, Ser: 1.19 mg/dL (ref 0.76–1.27)
Glucose: 130 mg/dL — ABNORMAL HIGH (ref 70–99)
Potassium: 4.7 mmol/L (ref 3.5–5.2)
Sodium: 137 mmol/L (ref 134–144)
eGFR: 69 mL/min/{1.73_m2} (ref >59)

## 2023-07-29 ENCOUNTER — Encounter: Payer: Self-pay | Admitting: Internal Medicine

## 2023-09-14 ENCOUNTER — Encounter (HOSPITAL_COMMUNITY): Payer: Self-pay

## 2023-09-15 ENCOUNTER — Telehealth (HOSPITAL_COMMUNITY): Payer: Self-pay | Admitting: *Deleted

## 2023-09-15 NOTE — Telephone Encounter (Signed)
 Attempted to call patient regarding upcoming cardiac CT appointment. Unable to leave VM. Johney Frame RN Navigator Cardiac Imaging [facility] [phone] Office

## 2023-09-16 ENCOUNTER — Ambulatory Visit (HOSPITAL_COMMUNITY)
Admission: RE | Admit: 2023-09-16 | Discharge: 2023-09-16 | Disposition: A | Discharge status: Home / Self Care | Source: Ambulatory Visit | Attending: Internal Medicine | Admitting: Internal Medicine

## 2023-09-16 ENCOUNTER — Other Ambulatory Visit: Payer: Self-pay | Admitting: Student

## 2023-09-16 DIAGNOSIS — R931 Abnormal findings on diagnostic imaging of heart and coronary circulation: Secondary | ICD-10-CM | POA: Insufficient documentation

## 2023-09-16 DIAGNOSIS — I5042 Chronic combined systolic (congestive) and diastolic (congestive) heart failure: Secondary | ICD-10-CM | POA: Insufficient documentation

## 2023-09-16 DIAGNOSIS — I38 Endocarditis, valve unspecified: Secondary | ICD-10-CM | POA: Insufficient documentation

## 2023-09-16 DIAGNOSIS — I2510 Atherosclerotic heart disease of native coronary artery without angina pectoris: Secondary | ICD-10-CM | POA: Diagnosis present

## 2023-09-16 DIAGNOSIS — I10 Essential (primary) hypertension: Secondary | ICD-10-CM

## 2023-09-16 MED ORDER — IOHEXOL 350 MG/ML SOLN
95.0000 mL | Freq: Once | INTRAVENOUS | Status: AC | PRN
  Administered 2023-09-16: 95 mL via INTRAVENOUS

## 2023-09-16 MED ORDER — NITROGLYCERIN 0.4 MG SL SUBL
SUBLINGUAL_TABLET | SUBLINGUAL | Status: AC
  Filled 2023-09-16: qty 2

## 2023-09-16 MED ORDER — METOPROLOL TARTRATE 5 MG/5ML IV SOLN: 10.0000 mg | Freq: Once | INTRAVENOUS | Status: DC | PRN

## 2023-09-16 MED ORDER — DILTIAZEM HCL 25 MG/5ML IV SOLN: 10.0000 mg | INTRAVENOUS | Status: DC | PRN

## 2023-09-16 MED ORDER — NITROGLYCERIN 0.4 MG SL SUBL
0.8000 mg | SUBLINGUAL_TABLET | Freq: Once | SUBLINGUAL | Status: AC
  Administered 2023-09-16: 0.8 mg via SUBLINGUAL

## 2023-09-17 ENCOUNTER — Telehealth: Payer: Self-pay | Admitting: Internal Medicine

## 2023-09-17 ENCOUNTER — Ambulatory Visit (HOSPITAL_BASED_OUTPATIENT_CLINIC_OR_DEPARTMENT_OTHER)
Admission: RE | Admit: 2023-09-17 | Discharge: 2023-09-17 | Disposition: A | Discharge status: Home / Self Care | Source: Ambulatory Visit | Attending: Cardiology | Admitting: Cardiology

## 2023-09-17 ENCOUNTER — Encounter: Payer: Self-pay | Admitting: Internal Medicine

## 2023-09-17 ENCOUNTER — Other Ambulatory Visit: Payer: Self-pay | Admitting: Cardiology

## 2023-09-17 DIAGNOSIS — R931 Abnormal findings on diagnostic imaging of heart and coronary circulation: Secondary | ICD-10-CM | POA: Diagnosis not present

## 2023-09-17 DIAGNOSIS — I2510 Atherosclerotic heart disease of native coronary artery without angina pectoris: Secondary | ICD-10-CM

## 2023-09-17 LAB — POCT I-STAT CREATININE: Creatinine, Ser: 1.3 mg/dL — ABNORMAL HIGH (ref 0.61–1.24)

## 2023-09-17 NOTE — Telephone Encounter (Signed)
 Secure chat sent to Verne Golden, Dr Daril Edge scheduler to assist with add on appointment.  She states she will contact Dr Paulita Boss and the pt re: scheduling.

## 2023-09-17 NOTE — Telephone Encounter (Signed)
 This has been addressed in another encounter.  Please see that encounter for complete details.

## 2023-09-17 NOTE — Telephone Encounter (Signed)
 Patient called back informed him of Dr. Daril Edge message. Patient verbalized understanding and has appointment on Monday.

## 2023-09-17 NOTE — Telephone Encounter (Signed)
 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 Reviewed: Distal LM to ostial LAD, prox LAD, and Prox RCA significant lesions on cursory review.  Called Mr. Hendricksen: Left VM- we need to reach out to review results.  Called Graig Patch: Emergency Contact.  She also works at [employer].   She was aware that he had the test.  She will help get in touch with Mr. Goulette. - We have discussed red flag symptoms- any of which would require ED/EMS evaluation. Add to Monday's scheduled with CBC, BMP, and EKG for [facility] next week    Stanly Leavens, MD Cardiologist [facility]  [address] [place], [address] [place], [place] [address] [phone]  1:51 PM

## 2023-09-20 ENCOUNTER — Ambulatory Visit: Attending: Internal Medicine | Admitting: Internal Medicine

## 2023-09-20 VITALS — BP 150/90 | HR 75 | Ht 71.0 in | Wt 232.0 lb

## 2023-09-20 DIAGNOSIS — R931 Abnormal findings on diagnostic imaging of heart and coronary circulation: Secondary | ICD-10-CM

## 2023-09-20 DIAGNOSIS — I2510 Atherosclerotic heart disease of native coronary artery without angina pectoris: Secondary | ICD-10-CM

## 2023-09-20 DIAGNOSIS — I5042 Chronic combined systolic (congestive) and diastolic (congestive) heart failure: Secondary | ICD-10-CM

## 2023-09-20 DIAGNOSIS — I38 Endocarditis, valve unspecified: Secondary | ICD-10-CM

## 2023-09-20 DIAGNOSIS — I509 Heart failure, unspecified: Secondary | ICD-10-CM

## 2023-09-20 DIAGNOSIS — I10 Essential (primary) hypertension: Secondary | ICD-10-CM

## 2023-09-20 NOTE — H&P (View-Only) (Signed)
 Cardiology Office Note:  .    Date:  09/20/2023  ID:  Paul Dickerson, DOB 01-01-1961, MRN [id number] PCP: Ernestina Headland, MD  [facility] Providers Cardiologist:  Jann Melody, MD     CC: Cardiac CT follow up  History of Present Illness: .    Paul Dickerson is a 63 year old male with hypertension and diabetes who presents for discussion of heart catheterization due to significant distal left main osteo LAD disease found on recent cardiac CT.  A recent cardiac CT revealed significant distal left main osteo LAD disease. Despite these findings, he has no symptoms such as chest pain, chest pressure, chest tightness, stinging, or shortness of breath. He feels fantastic and has been working without issues.  An EKG performed today showed sinus rhythm with sinus arrhythmia and right bundle branch block, but no clear ischemic signs. His QRS axis has shifted slightly.  In 2007, he had a stent placed, although he does not recall the specifics of the procedure or the physician who performed it. The CT scan showed a significant blockage in the main artery where it splits, with some soft plaque present. There is also a severe blockage in the right coronary artery, which completely stops.  He consumes alcohol regularly and has not gone a week without drinking. He drinks because he likes the taste, and the last day he did not drink was today. He has not experienced withdrawal symptoms such as shakes.   Relevant histories: .  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   Studies Reviewed: .   Cardiac Studies & Procedures   ______________________________________________________________________________________________     ECHOCARDIOGRAM  ECHOCARDIOGRAM COMPLETE 02/23/2022  Narrative ECHOCARDIOGRAM REPORT    Patient Name:   Paul Dickerson Date of Exam: 02/23/2022 Medical Rec #:  [id number]          Height:       71.0 in Accession #:    [id number]        Weight:       220.0 lb Date of Birth:  May 04, 1961         BSA:          2.196 m Patient Age:    61 years          BP:           173/95 mmHg Patient Gender: M                 HR:           74 bpm. Exam Location:  Inpatient  Procedure: 2D Echo, Cardiac Doppler, Color Doppler and Intracardiac Opacification Agent  Indications:    Stroke I63.9  History:        Patient has no prior history of Echocardiogram examinations. Stroke; Risk Factors:ETOH and Hypertension.  Sonographer:    Ruta Cousins RDCS Referring Phys: [id number] DAN FLOYD  IMPRESSIONS   1. Left ventricular ejection fraction, by estimation, is 45 to 50%. Left ventricular ejection fraction by 2D MOD biplane is 48.5 %. The left ventricle has mildly decreased function. The left ventricle demonstrates regional wall motion abnormalities (see scoring diagram/findings for description). There is mild left ventricular hypertrophy. Left ventricular diastolic parameters are consistent with Grade I diastolic dysfunction (impaired relaxation). There is moderate hypokinesis of the left ventricular, basal inferior wall and inferolateral wall. 2. Right ventricular systolic function is mildly reduced.  The right ventricular size is normal. 3. A small pericardial effusion is present. The pericardial effusion is posterior to the left ventricle. 4. The mitral valve is abnormal. Mild mitral valve regurgitation. 5. The aortic valve is tricuspid. Aortic valve regurgitation is not visualized. 6. Aortic dilatation noted. There is borderline dilatation of the aortic root, measuring 38 mm. 7. The inferior vena cava is normal in size with greater than 50% respiratory variability, suggesting right atrial pressure of 3 mmHg.  Comparison(s): No prior Echocardiogram.  FINDINGS Left Ventricle: Left ventricular ejection fraction, by estimation, is 45 to 50%. Left ventricular ejection fraction by 2D MOD  biplane is 48.5 %. The left ventricle has mildly decreased function. The left ventricle demonstrates regional wall motion abnormalities. Moderate hypokinesis of the left ventricular, basal inferior wall and inferolateral wall. Definity  contrast agent was given IV to delineate the left ventricular endocardial borders. The left ventricular internal cavity size was normal in size. There is mild left ventricular hypertrophy. Left ventricular diastolic parameters are consistent with Grade I diastolic dysfunction (impaired relaxation). Indeterminate filling pressures.  Right Ventricle: The right ventricular size is normal. No increase in right ventricular wall thickness. Right ventricular systolic function is mildly reduced.  Left Atrium: Left atrial size was normal in size.  Right Atrium: Right atrial size was normal in size.  Pericardium: A small pericardial effusion is present. The pericardial effusion is posterior to the left ventricle.  Mitral Valve: The mitral valve is abnormal. Mild mitral annular calcification. Mild mitral valve regurgitation.  Tricuspid Valve: The tricuspid valve is grossly normal. Tricuspid valve regurgitation is trivial.  Aortic Valve: The aortic valve is tricuspid. Aortic valve regurgitation is not visualized.  Pulmonic Valve: The pulmonic valve was grossly normal. Pulmonic valve regurgitation is trivial.  Aorta: Aortic dilatation noted. There is borderline dilatation of the aortic root, measuring 38 mm.  Venous: The inferior vena cava is normal in size with greater than 50% respiratory variability, suggesting right atrial pressure of 3 mmHg.  IAS/Shunts: No atrial level shunt detected by color flow Doppler.   LEFT VENTRICLE PLAX 2D                        Biplane EF (MOD) LVIDd:         4.80 cm         LV Biplane EF:   Left LVIDs:         3.80 cm                          ventricular LV PW:         1.00 cm                          ejection LV IVS:        1.30 cm                           fraction by LVOT diam:     2.20 cm                          2D MOD LV SV:         63                               biplane  is LV SV Index:   29                               48.5 %. LVOT Area:     3.80 cm Diastology LV e' medial:    4.73 cm/s LV Volumes (MOD)               LV E/e' medial:  12.9 LV vol d, MOD    61.5 ml       LV e' lateral:   6.23 cm/s A2C:                           LV E/e' lateral: 9.8 LV vol d, MOD    103.0 ml A4C: LV vol s, MOD    30.7 ml A2C: LV vol s, MOD    54.0 ml A4C: LV SV MOD A2C:   30.8 ml LV SV MOD A4C:   103.0 ml LV SV MOD BP:    40.4 ml  RIGHT VENTRICLE RV S prime:     7.58 cm/s TAPSE (M-mode): 2.6 cm  LEFT ATRIUM             Index        RIGHT ATRIUM           Index LA diam:        4.00 cm 1.82 cm/m   RA Area:     16.80 cm LA Vol (A2C):   66.6 ml 30.33 ml/m  RA Volume:   43.40 ml  19.77 ml/m LA Vol (A4C):   56.9 ml 25.91 ml/m LA Biplane Vol: 62.6 ml 28.51 ml/m AORTIC VALVE LVOT Vmax:   87.90 cm/s LVOT Vmean:  54.100 cm/s LVOT VTI:    0.167 m  AORTA Ao Root diam: 3.80 cm Ao Asc diam:  3.60 cm  MITRAL VALVE MV Area (PHT): 5.13 cm    SHUNTS MV Decel Time: 148 msec    Systemic VTI:  0.17 m MV E velocity: 61.00 cm/s  Systemic Diam: 2.20 cm MV A velocity: 68.30 cm/s MV E/A ratio:  0.89  Dinah Franco MD Electronically signed by Dinah Franco MD Signature Date/Time: 02/23/2022/1:23:54 PM    Final      CT SCANS  CT CORONARY MORPH W/CTA COR W/SCORE 09/16/2023  Narrative HISTORY: Heart failure, known or suspected, initial workup  EXAM: Cardiac/Coronary CT  TECHNIQUE: The patient was scanned on a [employer].  PROTOCOL: A 120 kV prospective scan was triggered in the descending thoracic aorta at 111 HU's. Axial non-contrast 3 mm slices were carried out through the heart. The data set was analyzed on a dedicated work station and scored using the Agatston method. Gantry rotation  speed was 250 msecs and collimation was 0.6 mm. Heart rate was optimized medically and sl NTG was given. The 3D data set was reconstructed in 5% intervals of the 35-75 % of the R-R cycle. Systolic and diastolic phases were analyzed on a dedicated work station using MPR, MIP and VRT modes. The patient received 95mL OMNIPAQUE  IOHEXOL  350 MG/ML SOLN of contrast.  FINDINGS: Coronary calcium  score: The patient's coronary artery calcium  score is 2344, which places the patient in the 99th percentile.  Coronary arteries: Normal coronary origins.  Right dominance.  Right Coronary Artery: Normal caliber vessel, gives rise to PDA. Diffuse mixed calcified and noncalcified plaque. Proximal vessel with mixed plaque and 70-99% stenosis visually, though  there is artifact in this location that limits further interpretation. There is a 6 mm segment in the mid RCA, after the second acute marginal, that does not have contrast uptake. There is distal contrast noted. Cannot determine if this is a CTO with distal filling collaterals vs high grade stenosis given image quality.  Left Main Coronary Artery: Normal caliber vessel. Diffuse mixed calcified and noncalcified plaque. Distal left main to ostial LAD with 70-99% stenosis  Left Anterior Descending Coronary Artery: Normal caliber vessel. Diffuse mixed calcified and noncalcified plaque. Distal left main to ostial LAD with 70-99% stenosis. Two areas in proximal vessel with predominantly noncalcified plaque with 70-99% stenosis. Mid vessel with diffuse mixed plaque and focal 50-69% stenosis after D1. Distal vessel with focal 50-69% stenosis just distal to D2 and diffuse mixed plaque. Gives rise to two small diagonal branches.  Left Circumflex Artery: Normal caliber vessel. High risk plaque in proximal Lcx with appearance of protrusion of plaque into lumen and at least 50-69% . Gives rise to small first, large second OM branches. OM2 with scattered  plaque and proximal 25-49% stenosis.  Aorta: Normal size, 31 mm at the mid ascending aorta (level of the PA bifurcation) measured double oblique. Aortic atherosclerosis. No dissection seen in visualized portions of the aorta.  Aortic Valve: No calcifications. Trileaflet.  Other findings:  Normal pulmonary vein drainage into the left atrium.  Normal left atrial appendage without a thrombus.  Normal size of the pulmonary artery.  Normal appearance of the pericardium.  IMPRESSION: 1. Severe obstructive CAD seen in all vessels, CADRADS = 4. While there is diffuse plaque/stenosis, highest risk is distal left main to ostial LAD with 70-99% stenosis, with two additional 70-99% focal stenoses in proximal LAD. High risk plaque in proximal Lcx with appearance of protrusion of plaque into lumen and at least 50-69% stenosis. RCA has some limitations due to artifact, but suggests 70-99% stenosis proximally and either CTO or high grade stenosis in mid vessel. CT FFR will be performed and reported separately, though due to artifact only left sided system will be able to be evaluated.  2. Coronary calcium  score of 2344. This was 99th percentile for age-, sex-, and race- matched controls.  3. Only left system plaque volume able to be evaluated. Plaque volume (left side) is 1513 mm3 (calcified plaque 369 mm3; noncalcified plaque 1117 mm3).  4. Normal coronary origin with right dominance.  High risk findings called to Dr. Paulita Boss. Snapshot images of lesions saved to PACS.  INTERPRETATION:  CAD-RADS 4: Severe stenosis. (70-99% or > 50% left main). Cardiac catheterization or CT FFR is recommended. Consider symptom-guided anti-ischemic pharmacotherapy as well as risk factor modification per guideline directed care. Invasive coronary angiography recommended with revascularization per published guideline statements.   Electronically Signed By: Sheryle Donning M.D. On:  09/17/2023 14:09     ______________________________________________________________________________________________      Physical Exam:    VS:  BP (!) 150/90 (BP Location: Left Arm)   Pulse 75   Ht 5\' 11"  (1.803 m)   Wt 105.2 kg   SpO2 96%   BMI 32.36 kg/m    Wt Readings from Last 3 Encounters:  09/20/23 105.2 kg  07/26/23 103.9 kg  01/19/23 104.4 kg    Gen: no distress HEENT: No JVD, poor dentition Cardiac: No Rubs or Gallops, no murmur, RRR +2 radial pulses Respiratory: Clear to auscultation bilaterally, normal effort, normal  respiratory rate GI: Soft, nontender, non-distended  MS: No  edema;  moves all  extremities Integument: Skin feels warm Neuro:  At time of evaluation, alert and oriented to person/place/time/situation  Psych: Normal affect, patient feels well   ASSESSMENT AND PLAN: .    Coronary artery disease HFrEF mildly decreased Significant distal left main ostial LAD disease identified on recent cardiac CT. Asymptomatic with no chest pain or dyspnea. History of stent placement in 2007. Severe blockage in the right coronary artery. Potential need for intervention based on heart catheterization findings. Risks associated with soft plaque include embolization. Discussed potential for bypass surgery if indicated. Medical decision making includes assessing the severity of blockages and determining the need for intervention based on catheterization findings. If the left main area does not appear severe, additional testing such as FFR may be performed. If collateral flow is adequate and no significant symptoms are present, conservative management may be considered. However, intervention may be necessary if significant blockages are confirmed. - Perform heart catheterization to assess the severity of blockages. - Discuss potential need for bypass surgery based on catheterization findings. - Coordinate with interventional cardiologist for catheterization procedure. - Advise  hospital visit if symptoms such as chest pain or dyspnea occur. Risks and benefits of cardiac catheterization have been discussed with the patient.  These include bleeding, infection, kidney damage, stroke, heart attack, death.  The patient understands these risks and is willing to proceed.  Access recommendations: R radial Procedural considerations may need IABP- he is oddly asymptomatic with LM equivalent  Right bundle branch block EKG shows sinus rhythm with sinus arrhythmia and right bundle branch block. No ischemic signs noted. Slight shift in QRS axis observed.  Hypertension Elevated blood pressure. No specific discussion of current management or changes in treatment plan during this visit.  Type 2 diabetes mellitus Diabetes with previously noted elevated blood sugar. No specific discussion of current management or changes in treatment plan during this visit.  Goals of Care Discussed timing of heart catheterization in relation to his birthday. He prefers to avoid procedure on his birthday. Acknowledged potential for hospitalization if significant intervention is required. Discussed monitoring for alcohol withdrawal symptoms if hospitalized. - Schedule heart catheterization for a day other than his birthday. - Monitor for alcohol withdrawal symptoms if hospitalized post-procedure.  Gloriann Larger, MD FASE [facility] Cardiologist [facility]  [address] [place], [address] [place], [place] [address] [phone]  3:58 PM

## 2023-09-20 NOTE — Patient Instructions (Addendum)
 Medications; The current medical regimen is effective;  continue present plan and medications.  Lab Work: BMP CBC  If you have labs (blood work) drawn today and your tests are completely normal, you will receive your results only by: MyChart Message (if you have MyChart) OR A paper copy in the mail If you have any lab test that is abnormal or we need to change your treatment, we will call you to review the results.  Testing/Procedures: LEFT HEART CATH     [facility] A DEPT OF . [facility] AT [facility] [address] [place], [address] [place] [address] Dept: [phone] Loc: [phone]  KINSLEY NICKLAUS  09/20/2023  You are scheduled for a Cardiac Catheterization on Friday, April 25 with Dr. Antoinette Batman.  1. Please arrive at the [facility] (Main Entrance A) at [facility]: [address] [place], [place] [address] at 7:00 AM (two hours before your procedure to ensure your preparation). Free valet parking service is available.   Special note: Every effort is made to have your procedure done on time. Please understand that emergencies sometimes delay scheduled procedures.  2. Diet: Do not eat or drink anything after midnight prior to your procedure except sips of water to take medications.  3. Labs:  today  4. Medication instructions in preparation for your procedure:  Hold Metformin  and Hydrochlorothiazide  this AM.  On the morning of your procedure, take your Aspirin  81 mg and any morning medicines NOT listed above.  You may use sips of water.  5. Plan for one night stay--bring personal belongings. 6. Bring a current list of your medications and current insurance cards. 7. You MUST have a responsible person to drive you home. 8. Someone MUST be with you the first 24 hours after you arrive home or your discharge will be delayed. 9. Please wear clothes that are easy to get on and off and wear  slip-on shoes.  Thank you for allowing us  to care for you!   -- [facility]   Follow-Up: At [facility], you and your health needs are our priority.  As part of our continuing mission to provide you with exceptional heart care, our providers are all part of one team.  This team includes your primary Cardiologist (physician) and Advanced Practice Providers or APPs (Physician Assistants and Nurse Practitioners) who all work together to provide you with the care you need, when you need it.  Your next appointment:   2 week post cath  Provider:   With APP      Other Instructions       1st Floor: - Lobby - Registration  - Pharmacy  - Lab - Cafe  2nd Floor: - PV Lab - Diagnostic Testing (echo, CT, nuclear med)  3rd Floor: - Vacant  4th Floor: - TCTS (cardiothoracic surgery) - AFib Clinic - Structural Heart Clinic - Vascular Surgery  - Vascular Ultrasound  5th Floor: - HeartCare Cardiology (general and EP) - Clinical Pharmacy for coumadin, hypertension, lipid, weight-loss medications, and med management appointments    Valet parking services will be available as well.

## 2023-09-20 NOTE — Progress Notes (Signed)
 Cardiology Office Note:  .    Date:  09/20/2023  ID:  Paul Dickerson, DOB 01-01-1961, MRN [id number] PCP: Ernestina Headland, MD  [facility] Providers Cardiologist:  Jann Melody, MD     CC: Cardiac CT follow up  History of Present Illness: .    Paul Dickerson is a 63 year old male with hypertension and diabetes who presents for discussion of heart catheterization due to significant distal left main osteo LAD disease found on recent cardiac CT.  A recent cardiac CT revealed significant distal left main osteo LAD disease. Despite these findings, he has no symptoms such as chest pain, chest pressure, chest tightness, stinging, or shortness of breath. He feels fantastic and has been working without issues.  An EKG performed today showed sinus rhythm with sinus arrhythmia and right bundle branch block, but no clear ischemic signs. His QRS axis has shifted slightly.  In 2007, he had a stent placed, although he does not recall the specifics of the procedure or the physician who performed it. The CT scan showed a significant blockage in the main artery where it splits, with some soft plaque present. There is also a severe blockage in the right coronary artery, which completely stops.  He consumes alcohol regularly and has not gone a week without drinking. He drinks because he likes the taste, and the last day he did not drink was today. He has not experienced withdrawal symptoms such as shakes.   Relevant histories: .  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   Studies Reviewed: .   Cardiac Studies & Procedures   ______________________________________________________________________________________________     ECHOCARDIOGRAM  ECHOCARDIOGRAM COMPLETE 02/23/2022  Narrative ECHOCARDIOGRAM REPORT    Patient Name:   Paul Dickerson Date of Exam: 02/23/2022 Medical Rec #:  [id number]          Height:       71.0 in Accession #:    [id number]        Weight:       220.0 lb Date of Birth:  May 04, 1961         BSA:          2.196 m Patient Age:    61 years          BP:           173/95 mmHg Patient Gender: M                 HR:           74 bpm. Exam Location:  Inpatient  Procedure: 2D Echo, Cardiac Doppler, Color Doppler and Intracardiac Opacification Agent  Indications:    Stroke I63.9  History:        Patient has no prior history of Echocardiogram examinations. Stroke; Risk Factors:ETOH and Hypertension.  Sonographer:    Ruta Cousins RDCS Referring Phys: [id number] DAN FLOYD  IMPRESSIONS   1. Left ventricular ejection fraction, by estimation, is 45 to 50%. Left ventricular ejection fraction by 2D MOD biplane is 48.5 %. The left ventricle has mildly decreased function. The left ventricle demonstrates regional wall motion abnormalities (see scoring diagram/findings for description). There is mild left ventricular hypertrophy. Left ventricular diastolic parameters are consistent with Grade I diastolic dysfunction (impaired relaxation). There is moderate hypokinesis of the left ventricular, basal inferior wall and inferolateral wall. 2. Right ventricular systolic function is mildly reduced.  The right ventricular size is normal. 3. A small pericardial effusion is present. The pericardial effusion is posterior to the left ventricle. 4. The mitral valve is abnormal. Mild mitral valve regurgitation. 5. The aortic valve is tricuspid. Aortic valve regurgitation is not visualized. 6. Aortic dilatation noted. There is borderline dilatation of the aortic root, measuring 38 mm. 7. The inferior vena cava is normal in size with greater than 50% respiratory variability, suggesting right atrial pressure of 3 mmHg.  Comparison(s): No prior Echocardiogram.  FINDINGS Left Ventricle: Left ventricular ejection fraction, by estimation, is 45 to 50%. Left ventricular ejection fraction by 2D MOD  biplane is 48.5 %. The left ventricle has mildly decreased function. The left ventricle demonstrates regional wall motion abnormalities. Moderate hypokinesis of the left ventricular, basal inferior wall and inferolateral wall. Definity  contrast agent was given IV to delineate the left ventricular endocardial borders. The left ventricular internal cavity size was normal in size. There is mild left ventricular hypertrophy. Left ventricular diastolic parameters are consistent with Grade I diastolic dysfunction (impaired relaxation). Indeterminate filling pressures.  Right Ventricle: The right ventricular size is normal. No increase in right ventricular wall thickness. Right ventricular systolic function is mildly reduced.  Left Atrium: Left atrial size was normal in size.  Right Atrium: Right atrial size was normal in size.  Pericardium: A small pericardial effusion is present. The pericardial effusion is posterior to the left ventricle.  Mitral Valve: The mitral valve is abnormal. Mild mitral annular calcification. Mild mitral valve regurgitation.  Tricuspid Valve: The tricuspid valve is grossly normal. Tricuspid valve regurgitation is trivial.  Aortic Valve: The aortic valve is tricuspid. Aortic valve regurgitation is not visualized.  Pulmonic Valve: The pulmonic valve was grossly normal. Pulmonic valve regurgitation is trivial.  Aorta: Aortic dilatation noted. There is borderline dilatation of the aortic root, measuring 38 mm.  Venous: The inferior vena cava is normal in size with greater than 50% respiratory variability, suggesting right atrial pressure of 3 mmHg.  IAS/Shunts: No atrial level shunt detected by color flow Doppler.   LEFT VENTRICLE PLAX 2D                        Biplane EF (MOD) LVIDd:         4.80 cm         LV Biplane EF:   Left LVIDs:         3.80 cm                          ventricular LV PW:         1.00 cm                          ejection LV IVS:        1.30 cm                           fraction by LVOT diam:     2.20 cm                          2D MOD LV SV:         63                               biplane  is LV SV Index:   29                               48.5 %. LVOT Area:     3.80 cm Diastology LV e' medial:    4.73 cm/s LV Volumes (MOD)               LV E/e' medial:  12.9 LV vol d, MOD    61.5 ml       LV e' lateral:   6.23 cm/s A2C:                           LV E/e' lateral: 9.8 LV vol d, MOD    103.0 ml A4C: LV vol s, MOD    30.7 ml A2C: LV vol s, MOD    54.0 ml A4C: LV SV MOD A2C:   30.8 ml LV SV MOD A4C:   103.0 ml LV SV MOD BP:    40.4 ml  RIGHT VENTRICLE RV S prime:     7.58 cm/s TAPSE (M-mode): 2.6 cm  LEFT ATRIUM             Index        RIGHT ATRIUM           Index LA diam:        4.00 cm 1.82 cm/m   RA Area:     16.80 cm LA Vol (A2C):   66.6 ml 30.33 ml/m  RA Volume:   43.40 ml  19.77 ml/m LA Vol (A4C):   56.9 ml 25.91 ml/m LA Biplane Vol: 62.6 ml 28.51 ml/m AORTIC VALVE LVOT Vmax:   87.90 cm/s LVOT Vmean:  54.100 cm/s LVOT VTI:    0.167 m  AORTA Ao Root diam: 3.80 cm Ao Asc diam:  3.60 cm  MITRAL VALVE MV Area (PHT): 5.13 cm    SHUNTS MV Decel Time: 148 msec    Systemic VTI:  0.17 m MV E velocity: 61.00 cm/s  Systemic Diam: 2.20 cm MV A velocity: 68.30 cm/s MV E/A ratio:  0.89  Dinah Franco MD Electronically signed by Dinah Franco MD Signature Date/Time: 02/23/2022/1:23:54 PM    Final      CT SCANS  CT CORONARY MORPH W/CTA COR W/SCORE 09/16/2023  Narrative HISTORY: Heart failure, known or suspected, initial workup  EXAM: Cardiac/Coronary CT  TECHNIQUE: The patient was scanned on a [employer].  PROTOCOL: A 120 kV prospective scan was triggered in the descending thoracic aorta at 111 HU's. Axial non-contrast 3 mm slices were carried out through the heart. The data set was analyzed on a dedicated work station and scored using the Agatston method. Gantry rotation  speed was 250 msecs and collimation was 0.6 mm. Heart rate was optimized medically and sl NTG was given. The 3D data set was reconstructed in 5% intervals of the 35-75 % of the R-R cycle. Systolic and diastolic phases were analyzed on a dedicated work station using MPR, MIP and VRT modes. The patient received 95mL OMNIPAQUE  IOHEXOL  350 MG/ML SOLN of contrast.  FINDINGS: Coronary calcium  score: The patient's coronary artery calcium  score is 2344, which places the patient in the 99th percentile.  Coronary arteries: Normal coronary origins.  Right dominance.  Right Coronary Artery: Normal caliber vessel, gives rise to PDA. Diffuse mixed calcified and noncalcified plaque. Proximal vessel with mixed plaque and 70-99% stenosis visually, though  there is artifact in this location that limits further interpretation. There is a 6 mm segment in the mid RCA, after the second acute marginal, that does not have contrast uptake. There is distal contrast noted. Cannot determine if this is a CTO with distal filling collaterals vs high grade stenosis given image quality.  Left Main Coronary Artery: Normal caliber vessel. Diffuse mixed calcified and noncalcified plaque. Distal left main to ostial LAD with 70-99% stenosis  Left Anterior Descending Coronary Artery: Normal caliber vessel. Diffuse mixed calcified and noncalcified plaque. Distal left main to ostial LAD with 70-99% stenosis. Two areas in proximal vessel with predominantly noncalcified plaque with 70-99% stenosis. Mid vessel with diffuse mixed plaque and focal 50-69% stenosis after D1. Distal vessel with focal 50-69% stenosis just distal to D2 and diffuse mixed plaque. Gives rise to two small diagonal branches.  Left Circumflex Artery: Normal caliber vessel. High risk plaque in proximal Lcx with appearance of protrusion of plaque into lumen and at least 50-69% . Gives rise to small first, large second OM branches. OM2 with scattered  plaque and proximal 25-49% stenosis.  Aorta: Normal size, 31 mm at the mid ascending aorta (level of the PA bifurcation) measured double oblique. Aortic atherosclerosis. No dissection seen in visualized portions of the aorta.  Aortic Valve: No calcifications. Trileaflet.  Other findings:  Normal pulmonary vein drainage into the left atrium.  Normal left atrial appendage without a thrombus.  Normal size of the pulmonary artery.  Normal appearance of the pericardium.  IMPRESSION: 1. Severe obstructive CAD seen in all vessels, CADRADS = 4. While there is diffuse plaque/stenosis, highest risk is distal left main to ostial LAD with 70-99% stenosis, with two additional 70-99% focal stenoses in proximal LAD. High risk plaque in proximal Lcx with appearance of protrusion of plaque into lumen and at least 50-69% stenosis. RCA has some limitations due to artifact, but suggests 70-99% stenosis proximally and either CTO or high grade stenosis in mid vessel. CT FFR will be performed and reported separately, though due to artifact only left sided system will be able to be evaluated.  2. Coronary calcium  score of 2344. This was 99th percentile for age-, sex-, and race- matched controls.  3. Only left system plaque volume able to be evaluated. Plaque volume (left side) is 1513 mm3 (calcified plaque 369 mm3; noncalcified plaque 1117 mm3).  4. Normal coronary origin with right dominance.  High risk findings called to Dr. Paulita Boss. Snapshot images of lesions saved to PACS.  INTERPRETATION:  CAD-RADS 4: Severe stenosis. (70-99% or > 50% left main). Cardiac catheterization or CT FFR is recommended. Consider symptom-guided anti-ischemic pharmacotherapy as well as risk factor modification per guideline directed care. Invasive coronary angiography recommended with revascularization per published guideline statements.   Electronically Signed By: Sheryle Donning M.D. On:  09/17/2023 14:09     ______________________________________________________________________________________________      Physical Exam:    VS:  BP (!) 150/90 (BP Location: Left Arm)   Pulse 75   Ht 5\' 11"  (1.803 m)   Wt 105.2 kg   SpO2 96%   BMI 32.36 kg/m    Wt Readings from Last 3 Encounters:  09/20/23 105.2 kg  07/26/23 103.9 kg  01/19/23 104.4 kg    Gen: no distress HEENT: No JVD, poor dentition Cardiac: No Rubs or Gallops, no murmur, RRR +2 radial pulses Respiratory: Clear to auscultation bilaterally, normal effort, normal  respiratory rate GI: Soft, nontender, non-distended  MS: No  edema;  moves all  extremities Integument: Skin feels warm Neuro:  At time of evaluation, alert and oriented to person/place/time/situation  Psych: Normal affect, patient feels well   ASSESSMENT AND PLAN: .    Coronary artery disease HFrEF mildly decreased Significant distal left main ostial LAD disease identified on recent cardiac CT. Asymptomatic with no chest pain or dyspnea. History of stent placement in 2007. Severe blockage in the right coronary artery. Potential need for intervention based on heart catheterization findings. Risks associated with soft plaque include embolization. Discussed potential for bypass surgery if indicated. Medical decision making includes assessing the severity of blockages and determining the need for intervention based on catheterization findings. If the left main area does not appear severe, additional testing such as FFR may be performed. If collateral flow is adequate and no significant symptoms are present, conservative management may be considered. However, intervention may be necessary if significant blockages are confirmed. - Perform heart catheterization to assess the severity of blockages. - Discuss potential need for bypass surgery based on catheterization findings. - Coordinate with interventional cardiologist for catheterization procedure. - Advise  hospital visit if symptoms such as chest pain or dyspnea occur. Risks and benefits of cardiac catheterization have been discussed with the patient.  These include bleeding, infection, kidney damage, stroke, heart attack, death.  The patient understands these risks and is willing to proceed.  Access recommendations: R radial Procedural considerations may need IABP- he is oddly asymptomatic with LM equivalent  Right bundle branch block EKG shows sinus rhythm with sinus arrhythmia and right bundle branch block. No ischemic signs noted. Slight shift in QRS axis observed.  Hypertension Elevated blood pressure. No specific discussion of current management or changes in treatment plan during this visit.  Type 2 diabetes mellitus Diabetes with previously noted elevated blood sugar. No specific discussion of current management or changes in treatment plan during this visit.  Goals of Care Discussed timing of heart catheterization in relation to his birthday. He prefers to avoid procedure on his birthday. Acknowledged potential for hospitalization if significant intervention is required. Discussed monitoring for alcohol withdrawal symptoms if hospitalized. - Schedule heart catheterization for a day other than his birthday. - Monitor for alcohol withdrawal symptoms if hospitalized post-procedure.  Gloriann Larger, MD FASE [facility] Cardiologist [facility]  [address] [place], [address] [place], [place] [address] [phone]  3:58 PM

## 2023-09-21 LAB — BASIC METABOLIC PANEL WITH GFR
BUN/Creatinine Ratio: 7 — ABNORMAL LOW (ref 10–24)
BUN: 7 mg/dL — ABNORMAL LOW (ref 8–27)
CO2: 23 mmol/L (ref 20–29)
Calcium: 10.2 mg/dL (ref 8.6–10.2)
Chloride: 96 mmol/L (ref 96–106)
Creatinine, Ser: 1.04 mg/dL (ref 0.76–1.27)
Glucose: 124 mg/dL — ABNORMAL HIGH (ref 70–99)
Potassium: 4.5 mmol/L (ref 3.5–5.2)
Sodium: 134 mmol/L (ref 134–144)
eGFR: 82 mL/min/{1.73_m2} (ref >59)

## 2023-09-21 LAB — CBC
Hematocrit: 44.2 % (ref 37.5–51.0)
Hemoglobin: 14.2 g/dL (ref 13.0–17.7)
MCH: 24 pg — ABNORMAL LOW (ref 26.6–33.0)
MCHC: 32.1 g/dL (ref 31.5–35.7)
MCV: 75 fL — ABNORMAL LOW (ref 79–97)
Platelets: 242 10*3/uL (ref 150–450)
RBC: 5.92 x10E6/uL — ABNORMAL HIGH (ref 4.14–5.80)
RDW: 13.9 % (ref 11.6–15.4)
WBC: 4.7 10*3/uL (ref 3.4–10.8)

## 2023-09-22 ENCOUNTER — Encounter: Payer: Self-pay | Admitting: Internal Medicine

## 2023-09-22 ENCOUNTER — Telehealth: Payer: Self-pay | Admitting: *Deleted

## 2023-09-22 NOTE — Telephone Encounter (Signed)
 Cardiac Catheterization scheduled at [facility] for: Friday September 24, 2023 9 AM Arrival time [facility] Main Entrance A at: 7 AM  Nothing to eat after midnight prior to procedure, clear liquids until 5 AM day of procedure.  Medication instructions: -Hold:  Hydrochlorothiazide -AM of procedure  Metformin -day of procedure and 48 hours post procedure -Other usual morning medications can be taken with sips of water including aspirin  81 mg.  Plan to go home the same day, you will only stay overnight if medically necessary.  You must have responsible adult to drive you home.  Someone must be with you the first 24 hours after you arrive home.  Reviewed procedure instructions with patient.

## 2023-09-24 ENCOUNTER — Other Ambulatory Visit: Payer: Self-pay

## 2023-09-24 ENCOUNTER — Inpatient Hospital Stay (HOSPITAL_COMMUNITY)

## 2023-09-24 ENCOUNTER — Inpatient Hospital Stay (HOSPITAL_COMMUNITY)
Admission: RE | Admit: 2023-09-24 | Discharge: 2023-10-01 | DRG: 234 | Disposition: A | Discharge status: Home / Self Care | Attending: Thoracic Surgery (Cardiothoracic Vascular Surgery) | Admitting: Thoracic Surgery (Cardiothoracic Vascular Surgery)

## 2023-09-24 ENCOUNTER — Encounter (HOSPITAL_COMMUNITY)
Admission: RE | Disposition: A | Discharge status: Home / Self Care | Payer: Self-pay | Source: Home / Self Care | Attending: Thoracic Surgery (Cardiothoracic Vascular Surgery)

## 2023-09-24 ENCOUNTER — Encounter (HOSPITAL_COMMUNITY): Payer: Self-pay | Admitting: Cardiovascular Disease

## 2023-09-24 DIAGNOSIS — I471 Supraventricular tachycardia, unspecified: Secondary | ICD-10-CM | POA: Diagnosis not present

## 2023-09-24 DIAGNOSIS — I2510 Atherosclerotic heart disease of native coronary artery without angina pectoris: Secondary | ICD-10-CM

## 2023-09-24 DIAGNOSIS — I25118 Atherosclerotic heart disease of native coronary artery with other forms of angina pectoris: Secondary | ICD-10-CM | POA: Diagnosis not present

## 2023-09-24 DIAGNOSIS — D6959 Other secondary thrombocytopenia: Secondary | ICD-10-CM | POA: Diagnosis not present

## 2023-09-24 DIAGNOSIS — Z955 Presence of coronary angioplasty implant and graft: Secondary | ICD-10-CM | POA: Diagnosis not present

## 2023-09-24 DIAGNOSIS — Z7982 Long term (current) use of aspirin: Secondary | ICD-10-CM | POA: Diagnosis not present

## 2023-09-24 DIAGNOSIS — I5020 Unspecified systolic (congestive) heart failure: Secondary | ICD-10-CM | POA: Diagnosis present

## 2023-09-24 DIAGNOSIS — I69344 Monoplegia of lower limb following cerebral infarction affecting left non-dominant side: Secondary | ICD-10-CM

## 2023-09-24 DIAGNOSIS — Z7984 Long term (current) use of oral hypoglycemic drugs: Secondary | ICD-10-CM

## 2023-09-24 DIAGNOSIS — Z951 Presence of aortocoronary bypass graft: Secondary | ICD-10-CM

## 2023-09-24 DIAGNOSIS — Z01810 Encounter for preprocedural cardiovascular examination: Secondary | ICD-10-CM | POA: Diagnosis not present

## 2023-09-24 DIAGNOSIS — D62 Acute posthemorrhagic anemia: Secondary | ICD-10-CM | POA: Diagnosis not present

## 2023-09-24 DIAGNOSIS — I452 Bifascicular block: Secondary | ICD-10-CM | POA: Diagnosis not present

## 2023-09-24 DIAGNOSIS — J9811 Atelectasis: Secondary | ICD-10-CM | POA: Diagnosis not present

## 2023-09-24 DIAGNOSIS — I110 Hypertensive heart disease with heart failure: Secondary | ICD-10-CM | POA: Diagnosis present

## 2023-09-24 DIAGNOSIS — I252 Old myocardial infarction: Secondary | ICD-10-CM

## 2023-09-24 DIAGNOSIS — I509 Heart failure, unspecified: Secondary | ICD-10-CM | POA: Diagnosis not present

## 2023-09-24 DIAGNOSIS — E1165 Type 2 diabetes mellitus with hyperglycemia: Secondary | ICD-10-CM | POA: Diagnosis present

## 2023-09-24 DIAGNOSIS — I25110 Atherosclerotic heart disease of native coronary artery with unstable angina pectoris: Secondary | ICD-10-CM | POA: Diagnosis not present

## 2023-09-24 HISTORY — PX: LEFT HEART CATH AND CORONARY ANGIOGRAPHY: CATH118249

## 2023-09-24 LAB — ECHOCARDIOGRAM COMPLETE
AR max vel: 2.53 cm2
AV Area VTI: 2.79 cm2
AV Area mean vel: 2.69 cm2
AV Mean grad: 2 mmHg
AV Peak grad: 3.9 mmHg
Ao pk vel: 0.99 m/s
Area-P 1/2: 4.06 cm2
Height: 71 in
S' Lateral: 4 cm
Weight: 3705.6 [oz_av]

## 2023-09-24 LAB — GLUCOSE, CAPILLARY
Glucose-Capillary: 140 mg/dL — ABNORMAL HIGH (ref 70–99)
Glucose-Capillary: 125 mg/dL — ABNORMAL HIGH (ref 70–99)

## 2023-09-24 SURGERY — LEFT HEART CATH AND CORONARY ANGIOGRAPHY
Anesthesia: LOCAL

## 2023-09-24 MED ORDER — LIDOCAINE HCL (PF) 1 % IJ SOLN
INTRAMUSCULAR | Status: DC | PRN
  Administered 2023-09-24: 2 mL

## 2023-09-24 MED ORDER — VERAPAMIL HCL 2.5 MG/ML IV SOLN
INTRAVENOUS | Status: AC
  Filled 2023-09-24: qty 2

## 2023-09-24 MED ORDER — SODIUM CHLORIDE 0.9 % IV SOLN: INTRAVENOUS | Status: AC

## 2023-09-24 MED ORDER — ACETAMINOPHEN 325 MG PO TABS: 650.0000 mg | ORAL_TABLET | ORAL | Status: DC | PRN

## 2023-09-24 MED ORDER — MUPIROCIN 2 % EX OINT
1.0000 | TOPICAL_OINTMENT | Freq: Two times a day (BID) | CUTANEOUS | Status: DC
  Filled 2023-09-24: qty 22

## 2023-09-24 MED ORDER — MIDAZOLAM HCL 2 MG/2ML IJ SOLN
INTRAMUSCULAR | Status: DC | PRN
  Administered 2023-09-24: 2 mg via INTRAVENOUS

## 2023-09-24 MED ORDER — LOSARTAN POTASSIUM 50 MG PO TABS
100.0000 mg | ORAL_TABLET | Freq: Every day | ORAL | Status: AC
  Administered 2023-09-25: 100 mg via ORAL
  Filled 2023-09-24: qty 2

## 2023-09-24 MED ORDER — ASPIRIN 81 MG PO CHEW
81.0000 mg | CHEWABLE_TABLET | ORAL | Status: DC
Start: 2023-09-24 — End: 2023-09-24

## 2023-09-24 MED ORDER — FOLIC ACID 1 MG PO TABS: 1.0000 mg | ORAL_TABLET | Freq: Every day | ORAL | Status: DC

## 2023-09-24 MED ORDER — SODIUM CHLORIDE 0.9 % IV SOLN: 250.0000 mL | INTRAVENOUS | Status: AC | PRN

## 2023-09-24 MED ORDER — SODIUM CHLORIDE 0.9 % WEIGHT BASED INFUSION: 3.0000 mL/kg/h | INTRAVENOUS | Status: DC

## 2023-09-24 MED ORDER — LORAZEPAM 1 MG PO TABS: 1.0000 mg | ORAL_TABLET | ORAL | Status: DC | PRN

## 2023-09-24 MED ORDER — HYDRALAZINE HCL 20 MG/ML IJ SOLN
10.0000 mg | INTRAMUSCULAR | Status: AC | PRN
  Administered 2023-09-24: 10 mg via INTRAVENOUS

## 2023-09-24 MED ORDER — IOHEXOL 350 MG/ML SOLN
INTRAVENOUS | Status: DC | PRN
  Administered 2023-09-24: 55 mL via INTRA_ARTERIAL

## 2023-09-24 MED ORDER — FENTANYL CITRATE (PF) 100 MCG/2ML IJ SOLN
INTRAMUSCULAR | Status: DC | PRN
  Administered 2023-09-24: 25 ug via INTRAVENOUS

## 2023-09-24 MED ORDER — LABETALOL HCL 5 MG/ML IV SOLN: 10.0000 mg | INTRAVENOUS | Status: AC | PRN

## 2023-09-24 MED ORDER — HEPARIN SODIUM (PORCINE) 1000 UNIT/ML IJ SOLN
INTRAMUSCULAR | Status: AC
  Filled 2023-09-24: qty 10

## 2023-09-24 MED ORDER — FENTANYL CITRATE (PF) 100 MCG/2ML IJ SOLN
INTRAMUSCULAR | Status: AC
  Filled 2023-09-24: qty 2

## 2023-09-24 MED ORDER — THIAMINE MONONITRATE 100 MG PO TABS
100.0000 mg | ORAL_TABLET | Freq: Every day | ORAL | Status: DC
  Administered 2023-09-24 – 2023-09-26 (×3): 100 mg via ORAL
  Filled 2023-09-24 (×3): qty 1

## 2023-09-24 MED ORDER — SODIUM CHLORIDE 0.9% FLUSH
3.0000 mL | Freq: Two times a day (BID) | INTRAVENOUS | Status: DC
  Administered 2023-09-24 – 2023-09-26 (×4): 3 mL via INTRAVENOUS

## 2023-09-24 MED ORDER — SODIUM CHLORIDE 0.9% FLUSH: 3.0000 mL | INTRAVENOUS | Status: DC | PRN

## 2023-09-24 MED ORDER — VERAPAMIL HCL 2.5 MG/ML IV SOLN
INTRAVENOUS | Status: DC | PRN
  Administered 2023-09-24: 10 mL via INTRA_ARTERIAL

## 2023-09-24 MED ORDER — HYDRALAZINE HCL 20 MG/ML IJ SOLN
INTRAMUSCULAR | Status: AC
  Filled 2023-09-24: qty 1

## 2023-09-24 MED ORDER — MIDAZOLAM HCL 2 MG/2ML IJ SOLN
INTRAMUSCULAR | Status: AC
  Filled 2023-09-24: qty 2

## 2023-09-24 MED ORDER — PERFLUTREN LIPID MICROSPHERE
1.0000 mL | INTRAVENOUS | Status: AC | PRN
  Administered 2023-09-24: 2 mL via INTRAVENOUS

## 2023-09-24 MED ORDER — SODIUM CHLORIDE 0.9 % WEIGHT BASED INFUSION
1.0000 mL/kg/h | INTRAVENOUS | Status: DC
  Administered 2023-09-24: 1 mL/kg/h via INTRAVENOUS

## 2023-09-24 MED ORDER — THIAMINE HCL 100 MG/ML IJ SOLN
100.0000 mg | Freq: Every day | INTRAMUSCULAR | Status: DC
Start: 2023-09-24 — End: 2023-09-27

## 2023-09-24 MED ORDER — LIDOCAINE HCL (PF) 1 % IJ SOLN
INTRAMUSCULAR | Status: AC
  Filled 2023-09-24: qty 30

## 2023-09-24 MED ORDER — HEPARIN SODIUM (PORCINE) 1000 UNIT/ML IJ SOLN
INTRAMUSCULAR | Status: DC | PRN
  Administered 2023-09-24: 5000 [IU] via INTRAVENOUS

## 2023-09-24 MED ORDER — HEPARIN (PORCINE) IN NACL 1000-0.9 UT/500ML-% IV SOLN
INTRAVENOUS | Status: DC | PRN
  Administered 2023-09-24 (×2): 500 mL via INTRA_ARTERIAL

## 2023-09-24 MED ORDER — AMLODIPINE BESYLATE 5 MG PO TABS
10.0000 mg | ORAL_TABLET | Freq: Every day | ORAL | Status: DC
  Administered 2023-09-25 – 2023-09-26 (×2): 10 mg via ORAL
  Filled 2023-09-24 (×2): qty 2

## 2023-09-24 MED ORDER — ONDANSETRON HCL 4 MG/2ML IJ SOLN: 4.0000 mg | Freq: Four times a day (QID) | INTRAMUSCULAR | Status: DC | PRN

## 2023-09-24 MED ORDER — ADULT MULTIVITAMIN W/MINERALS CH
1.0000 | ORAL_TABLET | Freq: Every day | ORAL | Status: DC
  Administered 2023-09-24 – 2023-09-26 (×3): 1 via ORAL
  Filled 2023-09-24 (×3): qty 1

## 2023-09-24 MED ORDER — LORAZEPAM 0.5 MG PO TABS
0.5000 mg | ORAL_TABLET | ORAL | Status: DC | PRN
  Administered 2023-09-26: 0.5 mg via ORAL
  Filled 2023-09-24: qty 1

## 2023-09-24 MED ORDER — ATORVASTATIN CALCIUM 80 MG PO TABS
80.0000 mg | ORAL_TABLET | Freq: Every day | ORAL | Status: DC
  Administered 2023-09-25 – 2023-10-01 (×6): 80 mg via ORAL
  Filled 2023-09-24 (×6): qty 1

## 2023-09-24 MED ORDER — FOLIC ACID 1 MG PO TABS
1.0000 mg | ORAL_TABLET | Freq: Every day | ORAL | Status: DC
  Administered 2023-09-25 – 2023-09-26 (×2): 1 mg via ORAL
  Filled 2023-09-24 (×2): qty 1

## 2023-09-24 MED ORDER — HYDROCHLOROTHIAZIDE 25 MG PO TABS
25.0000 mg | ORAL_TABLET | Freq: Every day | ORAL | Status: DC
  Administered 2023-09-25 – 2023-09-26 (×2): 25 mg via ORAL
  Filled 2023-09-24 (×2): qty 1

## 2023-09-24 MED ORDER — ASPIRIN 81 MG PO TBEC
81.0000 mg | DELAYED_RELEASE_TABLET | Freq: Every day | ORAL | Status: DC
  Administered 2023-09-25 – 2023-09-26 (×2): 81 mg via ORAL
  Filled 2023-09-24 (×2): qty 1

## 2023-09-24 SURGICAL SUPPLY — 6 items
CATH 5FR JL3.5 JR4 ANG PIG MP (CATHETERS) IMPLANT
DEVICE RAD COMP TR BAND LRG (VASCULAR PRODUCTS) IMPLANT
GLIDESHEATH SLEND SS 6F .021 (SHEATH) IMPLANT
GUIDEWIRE INQWIRE 1.5J.035X260 (WIRE) IMPLANT
PACK CARDIAC CATHETERIZATION (CUSTOM PROCEDURE TRAY) ×2 IMPLANT
SET ATX-X65L (MISCELLANEOUS) IMPLANT

## 2023-09-24 NOTE — Plan of Care (Signed)
 Arrived from Cath Lab. Placed on cardiac monitor. Plan of care explained to patient and family members.   Problem: Education: Goal: Understanding of CV disease, CV risk reduction, and recovery process will improve Outcome: Progressing Goal: Individualized Educational Video(s) Outcome: Progressing   Problem: Activity: Goal: Ability to return to baseline activity level will improve Outcome: Progressing   Problem: Cardiovascular: Goal: Ability to achieve and maintain adequate cardiovascular perfusion will improve Outcome: Progressing Goal: Vascular access site(s) Level 0-1 will be maintained Outcome: Progressing

## 2023-09-24 NOTE — Progress Notes (Signed)
 Arrived to [facility] 6E Room 6E 07.  Vitals and weight obtained and placed on telemetry monitor.     09/24/23 1428 09/24/23 1437  Vitals  Temp Source  --  Oral  BP  --  (!) 195/88  MAP (mmHg)  --  120  BP Location  --  Left Arm  BP Method  --  Automatic  Patient Position (if appropriate)  --  Sitting  Pulse Rate  --  67  Pulse Rate Source  --  Monitor  ECG Heart Rate  --  68  Resp  --  18  MEWS COLOR  MEWS Score Color  --  Green  Oxygen Therapy  SpO2  --  99 %  O2 Device  --  Room Air  Height and Weight  Weight 105.1 kg  --   Type of Scale Used Standing  --   Type of Weight Actual  --   BMI (Calculated) 32.32  --   ECG Monitoring  Telemetry Box Number  --  [id number]  Tele Box Verification Completed by Second Verifier  --  Completed (Olivia NT)  MEWS Score  MEWS Temp  --  0  MEWS Systolic  --  0  MEWS Pulse  --  0  MEWS RR  --  0  MEWS LOC  --  0  MEWS Score  --  0

## 2023-09-24 NOTE — H&P (View-Only) (Signed)
 [address]       [place] [address]             [phone]        Paul Dickerson [facility] Medical Record #[id number] Date of Birth: May 05, 1961  Referring: Odie Benne* Primary Care: Ernestina Headland, MD Primary Cardiologist:Mahesh Miki Alert, MD  Chief Complaint:   Severe coronary artery disease  History of Present Illness:    We are asked to see this 63 year old male in cardiothoracic surgical consultation for consideration of CABG.  He has known risk factors for cardiac disease including history of previous coronary stent, hypertension and diabetes.  He recently underwent cardiac CT scan where he was found to have significant distal left main/ostial LAD disease.  He denies significant cardiac symptoms.  Due to this he was recommended by cardiology to undergo cardiac catheterization which was done today.  This revealed significant left main and multivessel coronary artery disease (3VD).  Full report is as described below.  He has noted to have a reduced EF of 35 to 45% by visual estimate on this study.  LVEDP was mildly elevated.  An echocardiogram has been ordered for today and results are currently pending..  Previous study done in 2023 showed an EF of 45 to 50%.  He was found to have a right bundle branch block on EKG with no ischemic signs noted.  His most recent hemoglobin A1c in epic was 7.6 in August 2024.     Current Activity/ Functional Status: Patient was independent with mobility/ambulation, transfers, ADL's, IADL's.   Zubrod Score: At the time of surgery this patient's most appropriate activity status/level should be described as: [x]     0    Normal activity, no symptoms []     1    Restricted in physical strenuous activity but ambulatory, able to do out light work []     2    Ambulatory and capable of self care, unable to do work activities, up and about                 more than 50%  Of the time                            []     3     Only limited self care, in bed greater than 50% of waking hours []     4    Completely disabled, no self care, confined to bed or chair []     5    Moribund  Past Medical History:  Diagnosis Date   Hypertension    Type 2 diabetes mellitus (HCC)   Stent 2007- not sure of which vessel CVA 2023- full resolution of sx per patient except "drags left leg"  PSH: none  Social History   Tobacco Use  Smoking Status Never  Smokeless Tobacco Never    Social History   Substance and Sexual Activity  Alcohol Use None  Drinks beer daily   No Known Allergies  Current Facility-Administered Medications  Medication Dose Route Frequency Provider Last Rate Last Admin   0.9 %  sodium chloride  infusion   Intravenous Continuous Odie Benne, MD 50 mL/hr at 09/24/23 0919 Rate Change at 09/24/23 0919   0.9 %  sodium chloride  infusion  250 mL Intravenous PRN Odie Benne, MD       acetaminophen (TYLENOL) tablet 650 mg  650 mg Oral Q4H PRN  Odie Benne, MD       [START ON 09/25/2023] amLODipine  (NORVASC ) tablet 10 mg  10 mg Oral Daily Odie Benne, MD       [START ON 09/25/2023] aspirin  EC tablet 81 mg  81 mg Oral Daily Odie Benne, MD       [START ON 09/25/2023] atorvastatin  (LIPITOR) tablet 80 mg  80 mg Oral Daily Odie Benne, MD       [START ON 09/25/2023] folic acid  (FOLVITE ) tablet 1 mg  1 mg Oral Daily Odie Benne, MD       folic acid  (FOLVITE ) tablet 1 mg  1 mg Oral Daily Johnie Nailer B, NP       hydrALAZINE  (APRESOLINE ) injection 10 mg  10 mg Intravenous Q20 Min PRN Odie Benne, MD       [START ON 09/25/2023] hydrochlorothiazide  (HYDRODIURIL ) tablet 25 mg  25 mg Oral Daily Odie Benne, MD       labetalol (NORMODYNE) injection 10 mg  10 mg Intravenous Q10 min PRN Odie Benne, MD       LORazepam (ATIVAN) tablet 1-4 mg  1-4 mg Oral Q1H PRN Sanjuanita Cruz, NP       Or   LORazepam  (ATIVAN) tablet 0.5 mg  0.5 mg Oral Q1H PRN Sanjuanita Cruz, NP       [START ON 09/25/2023] losartan  (COZAAR ) tablet 100 mg  100 mg Oral Daily Odie Benne, MD       multivitamin with minerals tablet 1 tablet  1 tablet Oral Daily Johnie Nailer B, NP       ondansetron (ZOFRAN) injection 4 mg  4 mg Intravenous Q6H PRN Odie Benne, MD       sodium chloride  flush (NS) 0.9 % injection 3 mL  3 mL Intravenous Q12H Odie Benne, MD       sodium chloride  flush (NS) 0.9 % injection 3 mL  3 mL Intravenous PRN Odie Benne, MD       thiamine  (VITAMIN B1) tablet 100 mg  100 mg Oral Daily Johnie Nailer B, NP       Or   thiamine  (VITAMIN B1) injection 100 mg  100 mg Intravenous Daily Sanjuanita Cruz, NP        Medications Prior to Admission  Medication Sig Dispense Refill Last Dose/Taking   amLODipine  (NORVASC ) 5 MG tablet TAKE 1 TAB BY MOUTH AT BEDTIME 90 tablet 3 09/23/2023   aspirin  EC 81 MG tablet Take 1 tablet (81 mg total) by mouth daily. Swallow whole. 150 tablet 2 09/24/2023 at  6:40 AM   atorvastatin  (LIPITOR) 80 MG tablet Take 1 tablet (80 mg total) by mouth daily. 90 tablet 3 09/23/2023   folic acid  (FOLVITE ) 1 MG tablet Take 1 tablet (1 mg total) by mouth daily. 30 tablet 1 09/23/2023   hydrochlorothiazide  (HYDRODIURIL ) 25 MG tablet TAKE 1 TABLET BY MOUTH EVERY DAY 90 tablet 2 09/23/2023   losartan  (COZAAR ) 100 MG tablet TAKE 1 TABLET DAILY 90 tablet 1 09/23/2023   metFORMIN  (GLUCOPHAGE -XR) 500 MG 24 hr tablet TAKE 1 TABLET BY MOUTH EVERY DAY WITH BREAKFAST 90 tablet 1 09/23/2023   thiamine  (VITAMIN B1) 100 MG tablet Take 1 tablet (100 mg total) by mouth daily. 30 tablet 1 09/23/2023    No family history on file.   Review of Systems:   Review of Systems  Constitutional: Negative.   HENT:  Positive for congestion.   Eyes: Negative.  Respiratory: Negative.    Cardiovascular: Negative.   Gastrointestinal: Negative.   Genitourinary:  Negative.   Musculoskeletal: Negative.   Skin: Negative.   Neurological:        Prev CVA w/ some left leg weakness "occasional" per patient but original sx were dysarthria and right facial weakness which has resolved  Endo/Heme/Allergies: Negative.   Psychiatric/Behavioral:  Positive for depression. The patient has insomnia.   Poor dentition with multiple missing teeth    Physical Exam: BP (!) 151/90   Pulse (!) 57   Temp 97.9 F (36.6 C)   Resp 16   Ht 5\' 11"  (1.803 m)   Wt 104.3 kg   SpO2 98%   BMI 32.08 kg/m    General appearance: alert, cooperative, and no distress Head: Normocephalic, without obvious abnormality, atraumatic Neck: no adenopathy, no carotid bruit, no JVD, supple, symmetrical, trachea midline, and thyroid  not enlarged, symmetric, no tenderness/mass/nodules Lymph nodes: Cervical, supraclavicular, and axillary nodes normal. Resp: clear to auscultation bilaterally Back: symmetric, no curvature. ROM normal. No CVA tenderness. Cardio: regular rate and rhythm, S1, S2 normal, no murmur, click, rub or gallop GI: soft, non-tender; bowel sounds normal; no masses,  no organomegaly Extremities: extremities normal, atraumatic, no cyanosis or edema and pedal pulses intact, left weaker than right LE Neurologic: Grossly normal  Diagnostic Studies & Laboratory data:     Recent Radiology Findings:   CARDIAC CATHETERIZATION Result Date: 09/24/2023   Prox RCA lesion is 95% stenosed.   Mid RCA lesion is 99% stenosed.   Mid LM to Dist LM lesion is 70% stenosed.   Dist LM to Prox LAD lesion is 95% stenosed.   Ost Cx to Prox Cx lesion is 80% stenosed.   Mid Cx lesion is 40% stenosed.   2nd Mrg lesion is 50% stenosed.   Mid LAD lesion is 50% stenosed.   Dist LAD lesion is 50% stenosed.   There is mild to moderate left ventricular systolic dysfunction.   LV end diastolic pressure is mildly elevated.   The left ventricular ejection fraction is 35-45% by visual estimate. Severe triple  vessel CAD Severe distal left main stenosis Severe ostial LAD stenosis Severe ostial Circumflex stenosis Severe proximal RCA stenosis. Sub-total occlusion of the mid RCA with TIMI-2 flow beyond this stenosis. Hypokinesis of anterior wall Recommendations: Will admit to telemetry. He will need a CT surgery consult to discuss CABG. He is not having chest pain or dyspnea so will not place a balloon pump. Continue ASA and statin. Echo today. Consult CT surgery.     I have independently reviewed the above radiologic studies and discussed with the patient   Recent Lab Findings: Lab Results  Component Value Date   WBC 4.7 09/20/2023   HGB 14.2 09/20/2023   HCT 44.2 09/20/2023   PLT 242 09/20/2023   GLUCOSE 124 (H) 09/20/2023   CHOL 177 01/19/2023   TRIG 61 01/19/2023   HDL 64 01/19/2023   LDLCALC 101 (H) 01/19/2023   ALT 46 (H) 02/24/2022   AST 41 02/24/2022   NA 134 09/20/2023   K 4.5 09/20/2023   CL 96 09/20/2023   CREATININE 1.04 09/20/2023   BUN 7 (L) 09/20/2023   CO2 23 09/20/2023   TSH 1.515 02/23/2022   INR 1.1 02/22/2022   HGBA1C 7.6 (H) 01/19/2023      Assessment / Plan: Severe left main and three-vessel coronary artery disease with HFrEF.  Repeat echo and other preoperative studies are pending.  Hypertension Diabetes mellitus type  2 Previous CVA Previous coronary stent/MI 2007  Will plan for CABG this Monday if no new issues arise in surgeon evaluation deems him to be an acceptable candidate to proceed.        I  spent 30 minutes counseling the patient face to face.   Lindi Revering, PA-C  09/24/2023 10:41 AM  Paul Dickerson is a 63 year old man with multiple cardiac risk factors including type 2 diabetes and hypertension.  He has a known prior stroke and has a history of coronary disease with a previous MI and percutaneous intervention in 2007.  He has been asymptomatic with no chest pain, pressure, tightness, or shortness of breath with activity.  Also denies fatigue.   Recently had a coronary CT which showed calcium  score in the 99th percentile.  Today he underwent cardiac catheterization which revealed 95% left main stenosis and severe three-vessel disease.  LAD is diffusely diseased.  Circumflex appears to be a good target.  RCA with small distal branches likely poor quality target.  Coronary artery bypass grafting is indicated for survival benefit.  I informed him of the general nature of the procedure including the need for general anesthesia, the incisions to be used, the use of cardiopulmonary bypass, and the use of temporary pacemaking wires and drainage tubes postoperatively.  We discussed the expected hospitalization and overall recovery.  I informed him of the indications, risks, benefits, and alternatives.  He understands the risks include, but not limited to MI, DVT, PE, stroke, bleeding, possible need for transfusion, infection, cardiac arrhythmias, respiratory failure, renal failure, as well as possibility of other unforeseeable complications.  He accepts the risks and agrees to proceed.  He has normal Allen's test on the left.  Will plan to use the left radial artery to graft the circumflex distribution along with a mammary to the LAD and vein to the distal right coronary.  For CABG first case Monday.  Milon Aloe Luna Salinas, MD [facility] [phone]

## 2023-09-24 NOTE — Interval H&P Note (Signed)
 History and Physical Interval Note:  09/24/2023 7:44 AM  [name]  has presented today for surgery, with the diagnosis of cad.  The various methods of treatment have been discussed with the patient and family. After consideration of risks, benefits and other options for treatment, the patient has consented to  Procedure(s): LEFT HEART CATH AND CORONARY ANGIOGRAPHY (N/A) as a surgical intervention.  The patient's history has been reviewed, patient examined, no change in status, stable for surgery.  I have reviewed the patient's chart and labs.  Questions were answered to the patient's satisfaction.    Cath Lab Visit (complete for each Cath Lab visit)  Clinical Evaluation Leading to the Procedure:   ACS: No.  Non-ACS:    Anginal Classification: No Symptoms  Anti-ischemic medical therapy: Minimal Therapy (1 class of medications)  Non-Invasive Test Results: Coronary CTA with severe CAD  Prior CABG: No previous CABG    Paul Dickerson

## 2023-09-24 NOTE — Consult Note (Addendum)
 [address]       [place] [address]             [phone]        JOHANN LESAR [facility] Medical Record #[id number] Date of Birth: May 05, 1961  Referring: Odie Benne* Primary Care: Ernestina Headland, MD Primary Cardiologist:Mahesh Miki Alert, MD  Chief Complaint:   Severe coronary artery disease  History of Present Illness:    We are asked to see this 63 year old male in cardiothoracic surgical consultation for consideration of CABG.  He has known risk factors for cardiac disease including history of previous coronary stent, hypertension and diabetes.  He recently underwent cardiac CT scan where he was found to have significant distal left main/ostial LAD disease.  He denies significant cardiac symptoms.  Due to this he was recommended by cardiology to undergo cardiac catheterization which was done today.  This revealed significant left main and multivessel coronary artery disease (3VD).  Full report is as described below.  He has noted to have a reduced EF of 35 to 45% by visual estimate on this study.  LVEDP was mildly elevated.  An echocardiogram has been ordered for today and results are currently pending..  Previous study done in 2023 showed an EF of 45 to 50%.  He was found to have a right bundle branch block on EKG with no ischemic signs noted.  His most recent hemoglobin A1c in epic was 7.6 in August 2024.     Current Activity/ Functional Status: Patient was independent with mobility/ambulation, transfers, ADL's, IADL's.   Zubrod Score: At the time of surgery this patient's most appropriate activity status/level should be described as: [x]     0    Normal activity, no symptoms []     1    Restricted in physical strenuous activity but ambulatory, able to do out light work []     2    Ambulatory and capable of self care, unable to do work activities, up and about                 more than 50%  Of the time                            []     3     Only limited self care, in bed greater than 50% of waking hours []     4    Completely disabled, no self care, confined to bed or chair []     5    Moribund  Past Medical History:  Diagnosis Date   Hypertension    Type 2 diabetes mellitus (HCC)   Stent 2007- not sure of which vessel CVA 2023- full resolution of sx per patient except "drags left leg"  PSH: none  Social History   Tobacco Use  Smoking Status Never  Smokeless Tobacco Never    Social History   Substance and Sexual Activity  Alcohol Use None  Drinks beer daily   No Known Allergies  Current Facility-Administered Medications  Medication Dose Route Frequency Provider Last Rate Last Admin   0.9 %  sodium chloride  infusion   Intravenous Continuous Odie Benne, MD 50 mL/hr at 09/24/23 0919 Rate Change at 09/24/23 0919   0.9 %  sodium chloride  infusion  250 mL Intravenous PRN Odie Benne, MD       acetaminophen (TYLENOL) tablet 650 mg  650 mg Oral Q4H PRN  Odie Benne, MD       [START ON 09/25/2023] amLODipine  (NORVASC ) tablet 10 mg  10 mg Oral Daily Odie Benne, MD       [START ON 09/25/2023] aspirin  EC tablet 81 mg  81 mg Oral Daily Odie Benne, MD       [START ON 09/25/2023] atorvastatin  (LIPITOR) tablet 80 mg  80 mg Oral Daily Odie Benne, MD       [START ON 09/25/2023] folic acid  (FOLVITE ) tablet 1 mg  1 mg Oral Daily Odie Benne, MD       folic acid  (FOLVITE ) tablet 1 mg  1 mg Oral Daily Johnie Nailer B, NP       hydrALAZINE  (APRESOLINE ) injection 10 mg  10 mg Intravenous Q20 Min PRN Odie Benne, MD       [START ON 09/25/2023] hydrochlorothiazide  (HYDRODIURIL ) tablet 25 mg  25 mg Oral Daily Odie Benne, MD       labetalol (NORMODYNE) injection 10 mg  10 mg Intravenous Q10 min PRN Odie Benne, MD       LORazepam (ATIVAN) tablet 1-4 mg  1-4 mg Oral Q1H PRN Sanjuanita Cruz, NP       Or   LORazepam  (ATIVAN) tablet 0.5 mg  0.5 mg Oral Q1H PRN Sanjuanita Cruz, NP       [START ON 09/25/2023] losartan  (COZAAR ) tablet 100 mg  100 mg Oral Daily Odie Benne, MD       multivitamin with minerals tablet 1 tablet  1 tablet Oral Daily Johnie Nailer B, NP       ondansetron (ZOFRAN) injection 4 mg  4 mg Intravenous Q6H PRN Odie Benne, MD       sodium chloride  flush (NS) 0.9 % injection 3 mL  3 mL Intravenous Q12H Odie Benne, MD       sodium chloride  flush (NS) 0.9 % injection 3 mL  3 mL Intravenous PRN Odie Benne, MD       thiamine  (VITAMIN B1) tablet 100 mg  100 mg Oral Daily Johnie Nailer B, NP       Or   thiamine  (VITAMIN B1) injection 100 mg  100 mg Intravenous Daily Sanjuanita Cruz, NP        Medications Prior to Admission  Medication Sig Dispense Refill Last Dose/Taking   amLODipine  (NORVASC ) 5 MG tablet TAKE 1 TAB BY MOUTH AT BEDTIME 90 tablet 3 09/23/2023   aspirin  EC 81 MG tablet Take 1 tablet (81 mg total) by mouth daily. Swallow whole. 150 tablet 2 09/24/2023 at  6:40 AM   atorvastatin  (LIPITOR) 80 MG tablet Take 1 tablet (80 mg total) by mouth daily. 90 tablet 3 09/23/2023   folic acid  (FOLVITE ) 1 MG tablet Take 1 tablet (1 mg total) by mouth daily. 30 tablet 1 09/23/2023   hydrochlorothiazide  (HYDRODIURIL ) 25 MG tablet TAKE 1 TABLET BY MOUTH EVERY DAY 90 tablet 2 09/23/2023   losartan  (COZAAR ) 100 MG tablet TAKE 1 TABLET DAILY 90 tablet 1 09/23/2023   metFORMIN  (GLUCOPHAGE -XR) 500 MG 24 hr tablet TAKE 1 TABLET BY MOUTH EVERY DAY WITH BREAKFAST 90 tablet 1 09/23/2023   thiamine  (VITAMIN B1) 100 MG tablet Take 1 tablet (100 mg total) by mouth daily. 30 tablet 1 09/23/2023    No family history on file.   Review of Systems:   Review of Systems  Constitutional: Negative.   HENT:  Positive for congestion.   Eyes: Negative.  Respiratory: Negative.    Cardiovascular: Negative.   Gastrointestinal: Negative.   Genitourinary:  Negative.   Musculoskeletal: Negative.   Skin: Negative.   Neurological:        Prev CVA w/ some left leg weakness "occasional" per patient but original sx were dysarthria and right facial weakness which has resolved  Endo/Heme/Allergies: Negative.   Psychiatric/Behavioral:  Positive for depression. The patient has insomnia.   Poor dentition with multiple missing teeth    Physical Exam: BP (!) 151/90   Pulse (!) 57   Temp 97.9 F (36.6 C)   Resp 16   Ht 5\' 11"  (1.803 m)   Wt 104.3 kg   SpO2 98%   BMI 32.08 kg/m    General appearance: alert, cooperative, and no distress Head: Normocephalic, without obvious abnormality, atraumatic Neck: no adenopathy, no carotid bruit, no JVD, supple, symmetrical, trachea midline, and thyroid  not enlarged, symmetric, no tenderness/mass/nodules Lymph nodes: Cervical, supraclavicular, and axillary nodes normal. Resp: clear to auscultation bilaterally Back: symmetric, no curvature. ROM normal. No CVA tenderness. Cardio: regular rate and rhythm, S1, S2 normal, no murmur, click, rub or gallop GI: soft, non-tender; bowel sounds normal; no masses,  no organomegaly Extremities: extremities normal, atraumatic, no cyanosis or edema and pedal pulses intact, left weaker than right LE Neurologic: Grossly normal  Diagnostic Studies & Laboratory data:     Recent Radiology Findings:   CARDIAC CATHETERIZATION Result Date: 09/24/2023   Prox RCA lesion is 95% stenosed.   Mid RCA lesion is 99% stenosed.   Mid LM to Dist LM lesion is 70% stenosed.   Dist LM to Prox LAD lesion is 95% stenosed.   Ost Cx to Prox Cx lesion is 80% stenosed.   Mid Cx lesion is 40% stenosed.   2nd Mrg lesion is 50% stenosed.   Mid LAD lesion is 50% stenosed.   Dist LAD lesion is 50% stenosed.   There is mild to moderate left ventricular systolic dysfunction.   LV end diastolic pressure is mildly elevated.   The left ventricular ejection fraction is 35-45% by visual estimate. Severe triple  vessel CAD Severe distal left main stenosis Severe ostial LAD stenosis Severe ostial Circumflex stenosis Severe proximal RCA stenosis. Sub-total occlusion of the mid RCA with TIMI-2 flow beyond this stenosis. Hypokinesis of anterior wall Recommendations: Will admit to telemetry. He will need a CT surgery consult to discuss CABG. He is not having chest pain or dyspnea so will not place a balloon pump. Continue ASA and statin. Echo today. Consult CT surgery.     I have independently reviewed the above radiologic studies and discussed with the patient   Recent Lab Findings: Lab Results  Component Value Date   WBC 4.7 09/20/2023   HGB 14.2 09/20/2023   HCT 44.2 09/20/2023   PLT 242 09/20/2023   GLUCOSE 124 (H) 09/20/2023   CHOL 177 01/19/2023   TRIG 61 01/19/2023   HDL 64 01/19/2023   LDLCALC 101 (H) 01/19/2023   ALT 46 (H) 02/24/2022   AST 41 02/24/2022   NA 134 09/20/2023   K 4.5 09/20/2023   CL 96 09/20/2023   CREATININE 1.04 09/20/2023   BUN 7 (L) 09/20/2023   CO2 23 09/20/2023   TSH 1.515 02/23/2022   INR 1.1 02/22/2022   HGBA1C 7.6 (H) 01/19/2023      Assessment / Plan: Severe left main and three-vessel coronary artery disease with HFrEF.  Repeat echo and other preoperative studies are pending.  Hypertension Diabetes mellitus type  2 Previous CVA Previous coronary stent/MI 2007  Will plan for CABG this Monday if no new issues arise in surgeon evaluation deems him to be an acceptable candidate to proceed.        I  spent 30 minutes counseling the patient face to face.   Lindi Revering, PA-C  09/24/2023 10:41 AM  Mr. Steeves is a 63 year old man with multiple cardiac risk factors including type 2 diabetes and hypertension.  He has a known prior stroke and has a history of coronary disease with a previous MI and percutaneous intervention in 2007.  He has been asymptomatic with no chest pain, pressure, tightness, or shortness of breath with activity.  Also denies fatigue.   Recently had a coronary CT which showed calcium  score in the 99th percentile.  Today he underwent cardiac catheterization which revealed 95% left main stenosis and severe three-vessel disease.  LAD is diffusely diseased.  Circumflex appears to be a good target.  RCA with small distal branches likely poor quality target.  Coronary artery bypass grafting is indicated for survival benefit.  I informed him of the general nature of the procedure including the need for general anesthesia, the incisions to be used, the use of cardiopulmonary bypass, and the use of temporary pacemaking wires and drainage tubes postoperatively.  We discussed the expected hospitalization and overall recovery.  I informed him of the indications, risks, benefits, and alternatives.  He understands the risks include, but not limited to MI, DVT, PE, stroke, bleeding, possible need for transfusion, infection, cardiac arrhythmias, respiratory failure, renal failure, as well as possibility of other unforeseeable complications.  He accepts the risks and agrees to proceed.  He has normal Allen's test on the left.  Will plan to use the left radial artery to graft the circumflex distribution along with a mammary to the LAD and vein to the distal right coronary.  For CABG first case Monday.  Milon Aloe Luna Salinas, MD [facility] [phone]

## 2023-09-24 NOTE — Progress Notes (Signed)
 Right TR band removed. No bleeding or hematoma noted. 4x4 tegaderm dressing applied to site. Post activity and precautions explained; patient verbalized understanding. Call bell near.Kaelynn Igo E

## 2023-09-25 ENCOUNTER — Inpatient Hospital Stay (HOSPITAL_COMMUNITY)

## 2023-09-25 DIAGNOSIS — Z01810 Encounter for preprocedural cardiovascular examination: Secondary | ICD-10-CM

## 2023-09-25 DIAGNOSIS — I25118 Atherosclerotic heart disease of native coronary artery with other forms of angina pectoris: Secondary | ICD-10-CM | POA: Diagnosis not present

## 2023-09-25 LAB — TYPE AND SCREEN
ABO/RH(D): A POS
Antibody Screen: NEGATIVE

## 2023-09-25 LAB — SURGICAL PCR SCREEN
MRSA, PCR: NEGATIVE
Staphylococcus aureus: NEGATIVE

## 2023-09-25 LAB — ABO/RH: ABO/RH(D): A POS

## 2023-09-25 MED ORDER — METOPROLOL TARTRATE 12.5 MG HALF TABLET
12.5000 mg | ORAL_TABLET | Freq: Once | ORAL | Status: AC
  Administered 2023-09-27: 12.5 mg via ORAL
  Filled 2023-09-25: qty 1

## 2023-09-25 MED ORDER — CHLORHEXIDINE GLUCONATE CLOTH 2 % EX PADS
6.0000 | MEDICATED_PAD | Freq: Once | CUTANEOUS | Status: AC
  Administered 2023-09-27: 6 via TOPICAL

## 2023-09-25 MED ORDER — CHLORHEXIDINE GLUCONATE 0.12 % MT SOLN
15.0000 mL | Freq: Once | OROMUCOSAL | Status: AC
  Administered 2023-09-27: 15 mL via OROMUCOSAL
  Filled 2023-09-25: qty 15

## 2023-09-25 MED ORDER — LOSARTAN POTASSIUM 50 MG PO TABS: 100.0000 mg | ORAL_TABLET | Freq: Every day | ORAL | Status: DC

## 2023-09-25 MED ORDER — DIAZEPAM 2 MG PO TABS
2.0000 mg | ORAL_TABLET | Freq: Once | ORAL | Status: AC
  Administered 2023-09-27: 2 mg via ORAL
  Filled 2023-09-25: qty 1

## 2023-09-25 MED ORDER — CHLORHEXIDINE GLUCONATE CLOTH 2 % EX PADS
6.0000 | MEDICATED_PAD | Freq: Once | CUTANEOUS | Status: AC
  Administered 2023-09-26: 6 via TOPICAL

## 2023-09-25 NOTE — Progress Notes (Signed)
 VASCULAR LAB    Pre CABG Dopplers have been performed.  See CV proc for preliminary results.   Uva Runkel, RVT 09/25/2023, 6:57 PM

## 2023-09-25 NOTE — Progress Notes (Signed)
 Received patient in chair. Pt agrees to pre-op education. Reviewed "move-in the tube' concept and activity restrictions after surgery. Introduced bed mobility for after OHS. Introduced I/S and stressed use 10 x /hr after surgery. Pt pulled 1500 mL. Pt voices that his girlfriend will be at home with him after discharge. Gave patient instructions on how to access the pre-op video. Pt verbalized understanding of the education, all questions answered. Encouraged OOB to chair.   Rosezena Contes MS, ACSM-CEP, CCRP  12:00 - 12:27

## 2023-09-25 NOTE — Progress Notes (Signed)
   Patient Name: Paul Dickerson Date of Encounter: 09/25/2023 [facility] Cardiologist: Jann Melody, MD   Interval Summary  .    Asymptomatic.  Vital Signs .    Vitals:   09/24/23 1647 09/24/23 1945 09/24/23 2317 09/25/23 0317  BP: (!) 160/99 (!) 169/81 (!) 148/88 124/82  Pulse: 66 66 64 (!) 58  Resp: 17 16 18 20   Temp: 98.1 F (36.7 C) 98.8 F (37.1 C) 98.1 F (36.7 C) 98.7 F (37.1 C)  TempSrc: Axillary Oral Oral Oral  SpO2: 99% 97% 98% 99%  Weight:      Height:        Intake/Output Summary (Last 24 hours) at 09/25/2023 0857 Last data filed at 09/24/2023 2000 Gross per 24 hour  Intake 320.5 ml  Output 2200 ml  Net -1879.5 ml      09/24/2023    2:28 PM 09/24/2023    7:23 AM 09/20/2023    3:36 PM  Last 3 Weights  Weight (lbs) 231 lb 9.6 oz 230 lb 232 lb  Weight (kg) 105.053 kg 104.327 kg 105.235 kg      Telemetry/ECG    Sinus rhythm- Personally Reviewed  Physical Exam .   GEN: No acute distress.   Neck: No JVD Cardiac: RRR, no murmurs, rubs, or gallops.  Respiratory: Clear to auscultation bilaterally. GI: Soft, nontender, non-distended  MS: No edema  Assessment & Plan .     63 year old man with hypertension, DM2, CAD with remote stent in 2007, found to have severe multivessel CAD including severe distal left main stenosis and proximal RCA subtotal occlusion, anterior wall hypokinesis, but preserved overall left ventricular systolic function, awaiting CABG on Monday.  Currently asymptomatic. Good functional status.  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  Hx of daily alcohol use.  For questions or updates, please contact [facility] Please consult [email or web address] for contact info under        Signed, Luana Rumple, MD

## 2023-09-26 ENCOUNTER — Encounter (HOSPITAL_COMMUNITY): Payer: Self-pay | Admitting: Cardiovascular Disease

## 2023-09-26 ENCOUNTER — Inpatient Hospital Stay (HOSPITAL_COMMUNITY)

## 2023-09-26 DIAGNOSIS — I25110 Atherosclerotic heart disease of native coronary artery with unstable angina pectoris: Secondary | ICD-10-CM | POA: Diagnosis not present

## 2023-09-26 LAB — BLOOD GAS, ARTERIAL
Acid-Base Excess: 3.2 mmol/L — ABNORMAL HIGH (ref 0.0–2.0)
Bicarbonate: 26.9 mmol/L (ref 20.0–28.0)
O2 Saturation: 99.9 %
Patient temperature: 37.1
pCO2 arterial: 37 mmHg (ref 32–48)
pH, Arterial: 7.47 — ABNORMAL HIGH (ref 7.35–7.45)
pO2, Arterial: 180 mmHg — ABNORMAL HIGH (ref 83–108)

## 2023-09-26 LAB — VAS US DOPPLER PRE CABG

## 2023-09-26 LAB — PROTIME-INR
INR: 1 (ref 0.8–1.2)
Prothrombin Time: 13.5 s (ref 11.4–15.2)

## 2023-09-26 LAB — HEMOGLOBIN A1C
Hgb A1c MFr Bld: 7 % — ABNORMAL HIGH (ref 4.8–5.6)
Mean Plasma Glucose: 154.2 mg/dL

## 2023-09-26 LAB — SARS CORONAVIRUS 2 BY RT PCR: SARS Coronavirus 2 by RT PCR: NEGATIVE

## 2023-09-26 LAB — APTT: aPTT: 27 s (ref 24–36)

## 2023-09-26 LAB — GLUCOSE, CAPILLARY: Glucose-Capillary: 184 mg/dL — ABNORMAL HIGH (ref 70–99)

## 2023-09-26 MED ORDER — TRANEXAMIC ACID 1000 MG/10ML IV SOLN
1.5000 mg/kg/h | INTRAVENOUS | Status: AC
  Administered 2023-09-27: 1.5 mg/kg/h via INTRAVENOUS
  Filled 2023-09-26 (×2): qty 25

## 2023-09-26 MED ORDER — POTASSIUM CHLORIDE 2 MEQ/ML IV SOLN
80.0000 meq | INTRAVENOUS | Status: DC
  Filled 2023-09-26: qty 40

## 2023-09-26 MED ORDER — CEFAZOLIN SODIUM-DEXTROSE 2-4 GM/100ML-% IV SOLN
2.0000 g | INTRAVENOUS | Status: AC
  Administered 2023-09-27 (×2): 2 g via INTRAVENOUS
  Filled 2023-09-26: qty 100

## 2023-09-26 MED ORDER — PHENYLEPHRINE HCL-NACL 20-0.9 MG/250ML-% IV SOLN
30.0000 ug/min | INTRAVENOUS | Status: AC
  Administered 2023-09-27: 20 ug/min via INTRAVENOUS
  Filled 2023-09-26: qty 250

## 2023-09-26 MED ORDER — VANCOMYCIN HCL 1.5 G IV SOLR
1500.0000 mg | INTRAVENOUS | Status: DC
  Filled 2023-09-26 (×2): qty 30

## 2023-09-26 MED ORDER — MAGNESIUM SULFATE 50 % IJ SOLN
40.0000 meq | INTRAMUSCULAR | Status: DC
  Filled 2023-09-26: qty 9.85

## 2023-09-26 MED ORDER — HEPARIN 30,000 UNITS/1000 ML (OHS) CELLSAVER SOLUTION
Status: DC
  Filled 2023-09-26: qty 1000

## 2023-09-26 MED ORDER — EPINEPHRINE HCL 5 MG/250ML IV SOLN IN NS
0.0000 ug/min | INTRAVENOUS | Status: DC
  Filled 2023-09-26: qty 250

## 2023-09-26 MED ORDER — VANCOMYCIN HCL 1500 MG/300ML IV SOLN
1500.0000 mg | INTRAVENOUS | Status: AC
Start: 2023-09-27 — End: 2023-09-28
  Administered 2023-09-27: 1500 mg via INTRAVENOUS
  Filled 2023-09-26: qty 300

## 2023-09-26 MED ORDER — NOREPINEPHRINE 4 MG/250ML-% IV SOLN
0.0000 ug/min | INTRAVENOUS | Status: AC
  Administered 2023-09-27: 4 ug/min via INTRAVENOUS
  Filled 2023-09-26: qty 250

## 2023-09-26 MED ORDER — TRANEXAMIC ACID (OHS) PUMP PRIME SOLUTION
2.0000 mg/kg | INTRAVENOUS | Status: DC
  Filled 2023-09-26: qty 2.1

## 2023-09-26 MED ORDER — INSULIN REGULAR(HUMAN) IN NACL 100-0.9 UT/100ML-% IV SOLN
INTRAVENOUS | Status: AC
  Administered 2023-09-27: 5.5 [IU]/h via INTRAVENOUS
  Filled 2023-09-26: qty 100

## 2023-09-26 MED ORDER — MILRINONE LACTATE IN DEXTROSE 20-5 MG/100ML-% IV SOLN
0.3000 ug/kg/min | INTRAVENOUS | Status: DC
  Filled 2023-09-26 (×2): qty 100

## 2023-09-26 MED ORDER — NITROGLYCERIN IN D5W 200-5 MCG/ML-% IV SOLN
2.0000 ug/min | INTRAVENOUS | Status: DC
  Filled 2023-09-26: qty 250

## 2023-09-26 MED ORDER — PLASMA-LYTE A IV SOLN
INTRAVENOUS | Status: DC
  Filled 2023-09-26: qty 2.5

## 2023-09-26 MED ORDER — CEFAZOLIN SODIUM-DEXTROSE 2-4 GM/100ML-% IV SOLN
2.0000 g | INTRAVENOUS | Status: DC
  Filled 2023-09-26: qty 100

## 2023-09-26 MED ORDER — DEXMEDETOMIDINE HCL IN NACL 400 MCG/100ML IV SOLN
0.1000 ug/kg/h | INTRAVENOUS | Status: AC
  Administered 2023-09-27: .2 ug/kg/h via INTRAVENOUS
  Filled 2023-09-26: qty 100

## 2023-09-26 MED ORDER — TRANEXAMIC ACID (OHS) BOLUS VIA INFUSION
15.0000 mg/kg | INTRAVENOUS | Status: AC
  Administered 2023-09-27: 1576.5 mg via INTRAVENOUS
  Filled 2023-09-26: qty 1577

## 2023-09-26 NOTE — Anesthesia Preprocedure Evaluation (Signed)
 Anesthesia Evaluation  Patient identified by MRN, date of birth, ID band Patient awake    Reviewed: Allergy & Precautions, NPO status , Patient's Chart, lab work & pertinent test results  History of Anesthesia Complications Negative for: history of anesthetic complications  Airway Mallampati: II  TM Distance: >3 FB Neck ROM: Full    Dental  (+) Dental Advisory Given, Missing   Pulmonary neg pulmonary ROS   Pulmonary exam normal        Cardiovascular hypertension, Pt. on medications + CAD and +CHF  Normal cardiovascular exam   '25 TTE - EF 60 to 65%. Left ventricular diastolic parameters are consistent with Grade II diastolic dysfunction (pseudonormalization). Left atrial size was mild to moderately dilated. Trivial MR. Aortic valve sclerosis/calcification is present, without any evidence of aortic stenosis.   '25 Cath -  Prox RCA lesion is 95% stenosed.   Mid RCA lesion is 99% stenosed.   Mid LM to Dist LM lesion is 70% stenosed.   Dist LM to Prox LAD lesion is 95% stenosed.   Ost Cx to Prox Cx lesion is 80% stenosed.   Mid Cx lesion is 40% stenosed.   2nd Mrg lesion is 50% stenosed.   Mid LAD lesion is 50% stenosed.   Dist LAD lesion is 50% stenosed.   There is mild to moderate left ventricular systolic dysfunction.   LV end diastolic pressure is mildly elevated.   The left ventricular ejection fraction is 35-45% by visual estimate.     Neuro/Psych  PSYCHIATRIC DISORDERS Anxiety     CVA, No Residual Symptoms    GI/Hepatic negative GI ROS,,,(+)     substance abuse  alcohol use  Endo/Other  diabetes, Type 2, Oral Hypoglycemic Agents   Obesity   Renal/GU negative Renal ROS     Musculoskeletal negative musculoskeletal ROS (+)    Abdominal   Peds  Hematology negative hematology ROS (+)   Anesthesia Other Findings   Reproductive/Obstetrics                              Anesthesia Physical Anesthesia Plan  ASA: 4  Anesthesia Plan: General   Post-op Pain Management:    Induction: Intravenous  PONV Risk Score and Plan: 2 and Treatment may vary due to age or medical condition  Airway Management Planned: Oral ETT  Additional Equipment: Arterial line, PA Cath, CVP, TEE and Ultrasound Guidance Line Placement  Intra-op Plan:   Post-operative Plan: Post-operative intubation/ventilation  Informed Consent: I have reviewed the patients History and Physical, chart, labs and discussed the procedure including the risks, benefits and alternatives for the proposed anesthesia with the patient or authorized representative who has indicated his/her understanding and acceptance.     Dental advisory given  Plan Discussed with: CRNA and Anesthesiologist  Anesthesia Plan Comments: (Arterial line must be right-sided due to plan for left radial artery harvest )        Anesthesia Quick Evaluation

## 2023-09-26 NOTE — Progress Notes (Signed)
   Patient Name: Paul Dickerson Date of Encounter: 09/26/2023 [facility] Cardiologist: Jann Melody, MD   Interval Summary  .    No angina or other complaints overnight.  Blood pressure is better this morning.  Vital Signs .    Vitals:   09/25/23 0927 09/25/23 1114 09/25/23 2029 09/26/23 0433  BP: (!) 178/92 (!) 167/79 (!) 151/77 122/82  Pulse:  65 80 67  Resp: 16  16 18   Temp: 98.6 F (37 C)  98.5 F (36.9 C) 97.8 F (36.6 C)  TempSrc: Oral  Oral Oral  SpO2: 100% 99% 98% 95%  Weight:      Height:        Intake/Output Summary (Last 24 hours) at 09/26/2023 0913 Last data filed at 09/25/2023 1000 Gross per 24 hour  Intake 360 ml  Output --  Net 360 ml      09/24/2023    2:28 PM 09/24/2023    7:23 AM 09/20/2023    3:36 PM  Last 3 Weights  Weight (lbs) 231 lb 9.6 oz 230 lb 232 lb  Weight (kg) 105.053 kg 104.327 kg 105.235 kg      Telemetry/ECG    Normal sinus rhythm- Personally Reviewed  Physical Exam .   GEN: No acute distress.   Neck: No JVD Cardiac: RRR, no murmurs, rubs, or gallops.  Respiratory: Clear to auscultation bilaterally. GI: Soft, nontender, non-distended  MS: No edema  Assessment & Plan .     63 year old man with hypertension, DM2, CAD with remote stent in 2007, found to have severe multivessel CAD including severe distal left main stenosis and proximal RCA subtotal occlusion, anterior wall hypokinesis, but preserved overall left ventricular systolic function, awaiting CABG on Monday.  Currently asymptomatic. Good functional status.  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  Hx of daily alcohol use, but has not had any signs of withdrawal while in house. Discussed expected progress after CABG tomorrow.  For questions or updates, please contact [facility] Please consult [email or web address] for contact info under        Signed, Luana Rumple, MD

## 2023-09-26 NOTE — Plan of Care (Signed)
   Problem: Nutrition: Goal: Adequate nutrition will be maintained Outcome: Progressing

## 2023-09-26 NOTE — Plan of Care (Signed)
  Problem: Education: Goal: Knowledge of General Education information will improve Description: Including pain rating scale, medication(s)/side effects and non-pharmacologic comfort measures Outcome: Progressing   Problem: Nutrition: Goal: Adequate nutrition will be maintained Outcome: Progressing   Problem: Coping: Goal: Level of anxiety will decrease Outcome: Progressing   Problem: Elimination: Goal: Will not experience complications related to bowel motility Outcome: Progressing Goal: Will not experience complications related to urinary retention Outcome: Progressing   Problem: Pain Managment: Goal: General experience of comfort will improve and/or be controlled Outcome: Completed/Met   Problem: Safety: Goal: Ability to remain free from injury will improve Outcome: Completed/Met

## 2023-09-27 ENCOUNTER — Encounter (HOSPITAL_COMMUNITY): Payer: Self-pay | Admitting: Cardiovascular Disease

## 2023-09-27 ENCOUNTER — Other Ambulatory Visit: Payer: Self-pay

## 2023-09-27 ENCOUNTER — Inpatient Hospital Stay (HOSPITAL_COMMUNITY)

## 2023-09-27 ENCOUNTER — Inpatient Hospital Stay (HOSPITAL_COMMUNITY): Payer: Self-pay | Admitting: Certified Registered"

## 2023-09-27 ENCOUNTER — Encounter (HOSPITAL_COMMUNITY)
Admission: RE | Disposition: A | Discharge status: Home / Self Care | Payer: Self-pay | Source: Home / Self Care | Attending: Thoracic Surgery (Cardiothoracic Vascular Surgery)

## 2023-09-27 DIAGNOSIS — I509 Heart failure, unspecified: Secondary | ICD-10-CM

## 2023-09-27 DIAGNOSIS — I2510 Atherosclerotic heart disease of native coronary artery without angina pectoris: Secondary | ICD-10-CM | POA: Diagnosis not present

## 2023-09-27 DIAGNOSIS — Z951 Presence of aortocoronary bypass graft: Secondary | ICD-10-CM

## 2023-09-27 DIAGNOSIS — I110 Hypertensive heart disease with heart failure: Secondary | ICD-10-CM | POA: Diagnosis not present

## 2023-09-27 HISTORY — PX: CORONARY ARTERY BYPASS GRAFT: SHX141

## 2023-09-27 HISTORY — PX: INTRAOPERATIVE TRANSESOPHAGEAL ECHOCARDIOGRAM: SHX5062

## 2023-09-27 LAB — GLUCOSE, CAPILLARY
Glucose-Capillary: 134 mg/dL — ABNORMAL HIGH (ref 70–99)
Glucose-Capillary: 194 mg/dL — ABNORMAL HIGH (ref 70–99)
Glucose-Capillary: 137 mg/dL — ABNORMAL HIGH (ref 70–99)
Glucose-Capillary: 119 mg/dL — ABNORMAL HIGH (ref 70–99)
Glucose-Capillary: 134 mg/dL — ABNORMAL HIGH (ref 70–99)
Glucose-Capillary: 121 mg/dL — ABNORMAL HIGH (ref 70–99)
Glucose-Capillary: 125 mg/dL — ABNORMAL HIGH (ref 70–99)
Glucose-Capillary: 146 mg/dL — ABNORMAL HIGH (ref 70–99)
Glucose-Capillary: 146 mg/dL — ABNORMAL HIGH (ref 70–99)
Glucose-Capillary: 137 mg/dL — ABNORMAL HIGH (ref 70–99)

## 2023-09-27 LAB — POCT I-STAT, CHEM 8
BUN: 10 mg/dL (ref 8–23)
BUN: 9 mg/dL (ref 8–23)
BUN: 8 mg/dL (ref 8–23)
BUN: 8 mg/dL (ref 8–23)
Calcium, Ion: 1.28 mmol/L (ref 1.15–1.40)
Calcium, Ion: 1.16 mmol/L (ref 1.15–1.40)
Calcium, Ion: 1.06 mmol/L — ABNORMAL LOW (ref 1.15–1.40)
Calcium, Ion: 1.25 mmol/L (ref 1.15–1.40)
Chloride: 97 mmol/L — ABNORMAL LOW (ref 98–111)
Chloride: 100 mmol/L (ref 98–111)
Chloride: 98 mmol/L (ref 98–111)
Chloride: 100 mmol/L (ref 98–111)
Creatinine, Ser: 1 mg/dL (ref 0.61–1.24)
Creatinine, Ser: 0.9 mg/dL (ref 0.61–1.24)
Creatinine, Ser: 0.9 mg/dL (ref 0.61–1.24)
Creatinine, Ser: 0.8 mg/dL (ref 0.61–1.24)
Glucose, Bld: 163 mg/dL — ABNORMAL HIGH (ref 70–99)
Glucose, Bld: 198 mg/dL — ABNORMAL HIGH (ref 70–99)
Glucose, Bld: 131 mg/dL — ABNORMAL HIGH (ref 70–99)
Glucose, Bld: 95 mg/dL (ref 70–99)
HCT: 45 % (ref 39.0–52.0)
HCT: 42 % (ref 39.0–52.0)
HCT: 34 % — ABNORMAL LOW (ref 39.0–52.0)
HCT: 35 % — ABNORMAL LOW (ref 39.0–52.0)
Hemoglobin: 15.3 g/dL (ref 13.0–17.0)
Hemoglobin: 14.3 g/dL (ref 13.0–17.0)
Hemoglobin: 11.6 g/dL — ABNORMAL LOW (ref 13.0–17.0)
Hemoglobin: 11.9 g/dL — ABNORMAL LOW (ref 13.0–17.0)
Potassium: 3.7 mmol/L (ref 3.5–5.1)
Potassium: 3.3 mmol/L — ABNORMAL LOW (ref 3.5–5.1)
Potassium: 4.1 mmol/L (ref 3.5–5.1)
Potassium: 3.7 mmol/L (ref 3.5–5.1)
Sodium: 137 mmol/L (ref 135–145)
Sodium: 134 mmol/L — ABNORMAL LOW (ref 135–145)
Sodium: 135 mmol/L (ref 135–145)
Sodium: 136 mmol/L (ref 135–145)
TCO2: 29 mmol/L (ref 22–32)
TCO2: 24 mmol/L (ref 22–32)
TCO2: 24 mmol/L (ref 22–32)
TCO2: 25 mmol/L (ref 22–32)

## 2023-09-27 LAB — POCT I-STAT 7, (LYTES, BLD GAS, ICA,H+H)
Acid-base deficit: 1 mmol/L (ref 0.0–2.0)
Acid-base deficit: 1 mmol/L (ref 0.0–2.0)
Acid-base deficit: 4 mmol/L — ABNORMAL HIGH (ref 0.0–2.0)
Acid-base deficit: 4 mmol/L — ABNORMAL HIGH (ref 0.0–2.0)
Acid-base deficit: 3 mmol/L — ABNORMAL HIGH (ref 0.0–2.0)
Bicarbonate: 23.7 mmol/L (ref 20.0–28.0)
Bicarbonate: 24.1 mmol/L (ref 20.0–28.0)
Bicarbonate: 21.7 mmol/L (ref 20.0–28.0)
Bicarbonate: 21.7 mmol/L (ref 20.0–28.0)
Bicarbonate: 22.6 mmol/L (ref 20.0–28.0)
Calcium, Ion: 1.01 mmol/L — ABNORMAL LOW (ref 1.15–1.40)
Calcium, Ion: 1.27 mmol/L (ref 1.15–1.40)
Calcium, Ion: 1.21 mmol/L (ref 1.15–1.40)
Calcium, Ion: 1.21 mmol/L (ref 1.15–1.40)
Calcium, Ion: 1.16 mmol/L (ref 1.15–1.40)
HCT: 34 % — ABNORMAL LOW (ref 39.0–52.0)
HCT: 34 % — ABNORMAL LOW (ref 39.0–52.0)
HCT: 35 % — ABNORMAL LOW (ref 39.0–52.0)
HCT: 34 % — ABNORMAL LOW (ref 39.0–52.0)
HCT: 32 % — ABNORMAL LOW (ref 39.0–52.0)
Hemoglobin: 11.6 g/dL — ABNORMAL LOW (ref 13.0–17.0)
Hemoglobin: 11.6 g/dL — ABNORMAL LOW (ref 13.0–17.0)
Hemoglobin: 11.9 g/dL — ABNORMAL LOW (ref 13.0–17.0)
Hemoglobin: 11.6 g/dL — ABNORMAL LOW (ref 13.0–17.0)
Hemoglobin: 10.9 g/dL — ABNORMAL LOW (ref 13.0–17.0)
O2 Saturation: 100 %
O2 Saturation: 100 %
O2 Saturation: 94 %
O2 Saturation: 98 %
O2 Saturation: 99 %
Patient temperature: 37.1
Patient temperature: 37.2
Potassium: 3.4 mmol/L — ABNORMAL LOW (ref 3.5–5.1)
Potassium: 3.7 mmol/L (ref 3.5–5.1)
Potassium: 3.4 mmol/L — ABNORMAL LOW (ref 3.5–5.1)
Potassium: 4.1 mmol/L (ref 3.5–5.1)
Potassium: 4.1 mmol/L (ref 3.5–5.1)
Sodium: 135 mmol/L (ref 135–145)
Sodium: 137 mmol/L (ref 135–145)
Sodium: 136 mmol/L (ref 135–145)
Sodium: 139 mmol/L (ref 135–145)
Sodium: 138 mmol/L (ref 135–145)
TCO2: 25 mmol/L (ref 22–32)
TCO2: 25 mmol/L (ref 22–32)
TCO2: 23 mmol/L (ref 22–32)
TCO2: 23 mmol/L (ref 22–32)
TCO2: 24 mmol/L (ref 22–32)
pCO2 arterial: 40.6 mmHg (ref 32–48)
pCO2 arterial: 42.3 mmHg (ref 32–48)
pCO2 arterial: 41.2 mmHg (ref 32–48)
pCO2 arterial: 41.8 mmHg (ref 32–48)
pCO2 arterial: 40 mmHg (ref 32–48)
pH, Arterial: 7.375 (ref 7.35–7.45)
pH, Arterial: 7.364 (ref 7.35–7.45)
pH, Arterial: 7.33 — ABNORMAL LOW (ref 7.35–7.45)
pH, Arterial: 7.325 — ABNORMAL LOW (ref 7.35–7.45)
pH, Arterial: 7.361 (ref 7.35–7.45)
pO2, Arterial: 383 mmHg — ABNORMAL HIGH (ref 83–108)
pO2, Arterial: 256 mmHg — ABNORMAL HIGH (ref 83–108)
pO2, Arterial: 76 mmHg — ABNORMAL LOW (ref 83–108)
pO2, Arterial: 117 mmHg — ABNORMAL HIGH (ref 83–108)
pO2, Arterial: 157 mmHg — ABNORMAL HIGH (ref 83–108)

## 2023-09-27 LAB — CBC
HCT: 35.3 % — ABNORMAL LOW (ref 39.0–52.0)
HCT: 32.8 % — ABNORMAL LOW (ref 39.0–52.0)
Hemoglobin: 10.9 g/dL — ABNORMAL LOW (ref 13.0–17.0)
Hemoglobin: 10 g/dL — ABNORMAL LOW (ref 13.0–17.0)
MCH: 23.9 pg — ABNORMAL LOW (ref 26.0–34.0)
MCH: 23.6 pg — ABNORMAL LOW (ref 26.0–34.0)
MCHC: 30.9 g/dL (ref 30.0–36.0)
MCHC: 30.5 g/dL (ref 30.0–36.0)
MCV: 77.2 fL — ABNORMAL LOW (ref 80.0–100.0)
MCV: 77.5 fL — ABNORMAL LOW (ref 80.0–100.0)
Platelets: 159 10*3/uL (ref 150–400)
Platelets: 158 10*3/uL (ref 150–400)
RBC: 4.57 MIL/uL (ref 4.22–5.81)
RBC: 4.23 MIL/uL (ref 4.22–5.81)
RDW: 13.8 % (ref 11.5–15.5)
RDW: 13.8 % (ref 11.5–15.5)
WBC: 12.5 10*3/uL — ABNORMAL HIGH (ref 4.0–10.5)
WBC: 10.6 10*3/uL — ABNORMAL HIGH (ref 4.0–10.5)
nRBC: 0 % (ref 0.0–0.2)
nRBC: 0 % (ref 0.0–0.2)

## 2023-09-27 LAB — POCT I-STAT EG7
Acid-Base Excess: 0 mmol/L (ref 0.0–2.0)
Bicarbonate: 26 mmol/L (ref 20.0–28.0)
Calcium, Ion: 1.08 mmol/L — ABNORMAL LOW (ref 1.15–1.40)
HCT: 34 % — ABNORMAL LOW (ref 39.0–52.0)
Hemoglobin: 11.6 g/dL — ABNORMAL LOW (ref 13.0–17.0)
O2 Saturation: 77 %
Potassium: 3.3 mmol/L — ABNORMAL LOW (ref 3.5–5.1)
Sodium: 136 mmol/L (ref 135–145)
TCO2: 27 mmol/L (ref 22–32)
pCO2, Ven: 45.9 mmHg (ref 44–60)
pH, Ven: 7.361 (ref 7.25–7.43)
pO2, Ven: 44 mmHg (ref 32–45)

## 2023-09-27 LAB — BASIC METABOLIC PANEL WITH GFR
Anion gap: 9 (ref 5–15)
BUN: 8 mg/dL (ref 8–23)
CO2: 21 mmol/L — ABNORMAL LOW (ref 22–32)
Calcium: 8.3 mg/dL — ABNORMAL LOW (ref 8.9–10.3)
Chloride: 106 mmol/L (ref 98–111)
Creatinine, Ser: 1.03 mg/dL (ref 0.61–1.24)
GFR, Estimated: >60 mL/min (ref >60)
Glucose, Bld: 137 mg/dL — ABNORMAL HIGH (ref 70–99)
Potassium: 4.1 mmol/L (ref 3.5–5.1)
Sodium: 136 mmol/L (ref 135–145)

## 2023-09-27 LAB — PROTIME-INR
INR: 1.4 — ABNORMAL HIGH (ref 0.8–1.2)
Prothrombin Time: 16.9 s — ABNORMAL HIGH (ref 11.4–15.2)

## 2023-09-27 LAB — HEMOGLOBIN AND HEMATOCRIT, BLOOD
HCT: 32.1 % — ABNORMAL LOW (ref 39.0–52.0)
Hemoglobin: 10 g/dL — ABNORMAL LOW (ref 13.0–17.0)

## 2023-09-27 LAB — PLATELET COUNT: Platelets: 160 10*3/uL (ref 150–400)

## 2023-09-27 LAB — MAGNESIUM: Magnesium: 2.6 mg/dL — ABNORMAL HIGH (ref 1.7–2.4)

## 2023-09-27 LAB — APTT: aPTT: 28 s (ref 24–36)

## 2023-09-27 SURGERY — CORONARY ARTERY BYPASS GRAFTING (CABG)
Anesthesia: General | Site: Chest

## 2023-09-27 MED ORDER — PROPOFOL 10 MG/ML IV BOLUS
INTRAVENOUS | Status: DC | PRN
  Administered 2023-09-27: 50 mg via INTRAVENOUS

## 2023-09-27 MED ORDER — OXYCODONE HCL 5 MG PO TABS
5.0000 mg | ORAL_TABLET | ORAL | Status: DC | PRN
  Administered 2023-09-28: 10 mg via ORAL
  Administered 2023-09-28: 5 mg via ORAL
  Administered 2023-09-28: 10 mg via ORAL
  Administered 2023-09-29 (×2): 5 mg via ORAL
  Filled 2023-09-27: qty 1
  Filled 2023-09-27: qty 2
  Filled 2023-09-27: qty 1
  Filled 2023-09-27: qty 2
  Filled 2023-09-27: qty 1

## 2023-09-27 MED ORDER — HEMOSTATIC AGENTS (NO CHARGE) OPTIME
TOPICAL | Status: DC | PRN
  Administered 2023-09-27: 1 via TOPICAL

## 2023-09-27 MED ORDER — DOCUSATE SODIUM 100 MG PO CAPS
200.0000 mg | ORAL_CAPSULE | Freq: Every day | ORAL | Status: DC
  Administered 2023-09-28 – 2023-10-01 (×4): 200 mg via ORAL
  Filled 2023-09-27 (×4): qty 2

## 2023-09-27 MED ORDER — CHLORHEXIDINE GLUCONATE CLOTH 2 % EX PADS
6.0000 | MEDICATED_PAD | Freq: Every day | CUTANEOUS | Status: DC
  Administered 2023-09-27 – 2023-09-30 (×4): 6 via TOPICAL

## 2023-09-27 MED ORDER — ONDANSETRON HCL 4 MG/2ML IJ SOLN: 4.0000 mg | Freq: Four times a day (QID) | INTRAMUSCULAR | Status: DC | PRN

## 2023-09-27 MED ORDER — INSULIN REGULAR(HUMAN) IN NACL 100-0.9 UT/100ML-% IV SOLN: INTRAVENOUS | Status: DC

## 2023-09-27 MED ORDER — CHLORHEXIDINE GLUCONATE 0.12 % MT SOLN
OROMUCOSAL | Status: AC
  Filled 2023-09-27: qty 15

## 2023-09-27 MED ORDER — SODIUM CHLORIDE (PF) 0.9 % IJ SOLN: OROMUCOSAL | Status: DC | PRN

## 2023-09-27 MED ORDER — NOREPINEPHRINE BITARTRATE 1 MG/ML IV SOLN
INTRAVENOUS | Status: DC | PRN
  Administered 2023-09-27 (×3): 1 mL via INTRAVENOUS

## 2023-09-27 MED ORDER — NITROGLYCERIN IN D5W 200-5 MCG/ML-% IV SOLN: 0.0000 ug/min | INTRAVENOUS | Status: DC

## 2023-09-27 MED ORDER — SODIUM CHLORIDE 0.9% FLUSH: 3.0000 mL | INTRAVENOUS | Status: DC | PRN

## 2023-09-27 MED ORDER — ASPIRIN 81 MG PO CHEW
324.0000 mg | CHEWABLE_TABLET | Freq: Once | ORAL | Status: DC
  Filled 2023-09-27: qty 4

## 2023-09-27 MED ORDER — PHENYLEPHRINE 80 MCG/ML (10ML) SYRINGE FOR IV PUSH (FOR BLOOD PRESSURE SUPPORT)
PREFILLED_SYRINGE | INTRAVENOUS | Status: AC
  Filled 2023-09-27: qty 10

## 2023-09-27 MED ORDER — ACETAMINOPHEN 160 MG/5ML PO SOLN: 1000.0000 mg | Freq: Four times a day (QID) | ORAL | Status: DC

## 2023-09-27 MED ORDER — ACETAMINOPHEN 160 MG/5ML PO SOLN
650.0000 mg | Freq: Once | ORAL | Status: AC
  Administered 2023-09-27: 650 mg
  Filled 2023-09-27: qty 20.3

## 2023-09-27 MED ORDER — PLASMA-LYTE A IV SOLN: INTRAVENOUS | Status: DC | PRN

## 2023-09-27 MED ORDER — CHLORHEXIDINE GLUCONATE 0.12 % MT SOLN
15.0000 mL | Freq: Once | OROMUCOSAL | Status: AC
  Administered 2023-09-27: 15 mL via OROMUCOSAL

## 2023-09-27 MED ORDER — VASOPRESSIN 20 UNIT/ML IV SOLN
INTRAVENOUS | Status: DC | PRN
Start: 2023-09-27 — End: 2023-09-27
  Administered 2023-09-27 (×2): 1 [IU] via INTRAVENOUS
  Administered 2023-09-27: 2 [IU] via INTRAVENOUS

## 2023-09-27 MED ORDER — ROCURONIUM BROMIDE 10 MG/ML (PF) SYRINGE
PREFILLED_SYRINGE | INTRAVENOUS | Status: DC | PRN
  Administered 2023-09-27: 20 mg via INTRAVENOUS
  Administered 2023-09-27: 100 mg via INTRAVENOUS
  Administered 2023-09-27: 50 mg via INTRAVENOUS
  Administered 2023-09-27: 10 mg via INTRAVENOUS

## 2023-09-27 MED ORDER — PHENYLEPHRINE 80 MCG/ML (10ML) SYRINGE FOR IV PUSH (FOR BLOOD PRESSURE SUPPORT)
PREFILLED_SYRINGE | INTRAVENOUS | Status: DC | PRN
Start: 2023-09-27 — End: 2023-09-27
  Administered 2023-09-27: 160 ug via INTRAVENOUS
  Administered 2023-09-27: 80 ug via INTRAVENOUS
  Administered 2023-09-27 (×2): 160 ug via INTRAVENOUS

## 2023-09-27 MED ORDER — ASPIRIN 81 MG PO CHEW
324.0000 mg | CHEWABLE_TABLET | Freq: Once | ORAL | Status: AC
  Administered 2023-09-27: 324 mg

## 2023-09-27 MED ORDER — LACTATED RINGERS IV SOLN: INTRAVENOUS | Status: AC

## 2023-09-27 MED ORDER — FENTANYL CITRATE (PF) 250 MCG/5ML IJ SOLN
INTRAMUSCULAR | Status: AC
  Filled 2023-09-27: qty 5

## 2023-09-27 MED ORDER — ALBUMIN HUMAN 5 % IV SOLN
250.0000 mL | INTRAVENOUS | Status: DC | PRN
  Administered 2023-09-27 (×4): 12.5 g via INTRAVENOUS
  Filled 2023-09-27 (×3): qty 250

## 2023-09-27 MED ORDER — MORPHINE SULFATE (PF) 2 MG/ML IV SOLN: 1.0000 mg | INTRAVENOUS | Status: DC | PRN

## 2023-09-27 MED ORDER — BISACODYL 5 MG PO TBEC
10.0000 mg | DELAYED_RELEASE_TABLET | Freq: Every day | ORAL | Status: DC
  Administered 2023-09-28 – 2023-10-01 (×4): 10 mg via ORAL
  Filled 2023-09-27 (×4): qty 2

## 2023-09-27 MED ORDER — METOPROLOL TARTRATE 12.5 MG HALF TABLET
12.5000 mg | ORAL_TABLET | Freq: Two times a day (BID) | ORAL | Status: DC
  Administered 2023-09-28 – 2023-09-29 (×3): 12.5 mg via ORAL
  Filled 2023-09-27 (×3): qty 1

## 2023-09-27 MED ORDER — SODIUM CHLORIDE 0.9% FLUSH
3.0000 mL | Freq: Two times a day (BID) | INTRAVENOUS | Status: DC
  Administered 2023-09-27 (×2): 3 mL via INTRAVENOUS
  Administered 2023-09-28: 10 mL via INTRAVENOUS
  Administered 2023-09-28 – 2023-09-29 (×2): 3 mL via INTRAVENOUS

## 2023-09-27 MED ORDER — CHLORHEXIDINE GLUCONATE 0.12 % MT SOLN
15.0000 mL | OROMUCOSAL | Status: AC
  Administered 2023-09-27: 15 mL via OROMUCOSAL
  Filled 2023-09-27: qty 15

## 2023-09-27 MED ORDER — SODIUM CHLORIDE 0.9 % IV SOLN: INTRAVENOUS | Status: AC

## 2023-09-27 MED ORDER — NOREPINEPHRINE 4 MG/250ML-% IV SOLN
0.0000 ug/min | INTRAVENOUS | Status: DC
  Administered 2023-09-27: 10 ug/min via INTRAVENOUS
  Filled 2023-09-27: qty 250

## 2023-09-27 MED ORDER — SODIUM CHLORIDE 0.9 % IV SOLN
INTRAVENOUS | Status: DC | PRN
Start: 2023-09-27 — End: 2023-09-27

## 2023-09-27 MED ORDER — SODIUM CHLORIDE 0.9% FLUSH
3.0000 mL | Freq: Two times a day (BID) | INTRAVENOUS | Status: DC
  Administered 2023-09-27: 3 mL via INTRAVENOUS
  Administered 2023-09-27 – 2023-09-28 (×2): 10 mL via INTRAVENOUS
  Administered 2023-09-28: 3 mL via INTRAVENOUS

## 2023-09-27 MED ORDER — MAGNESIUM SULFATE 4 GM/100ML IV SOLN
4.0000 g | Freq: Once | INTRAVENOUS | Status: AC
  Administered 2023-09-27: 4 g via INTRAVENOUS
  Filled 2023-09-27: qty 100

## 2023-09-27 MED ORDER — CLEVIDIPINE BUTYRATE 0.5 MG/ML IV EMUL
INTRAVENOUS | Status: AC
  Filled 2023-09-27: qty 50

## 2023-09-27 MED ORDER — BISACODYL 10 MG RE SUPP: 10.0000 mg | Freq: Every day | RECTAL | Status: DC

## 2023-09-27 MED ORDER — PROTAMINE SULFATE 10 MG/ML IV SOLN
INTRAVENOUS | Status: AC
  Filled 2023-09-27: qty 5

## 2023-09-27 MED ORDER — TRAMADOL HCL 50 MG PO TABS: 50.0000 mg | ORAL_TABLET | ORAL | Status: DC | PRN

## 2023-09-27 MED ORDER — PHENYLEPHRINE HCL-NACL 20-0.9 MG/250ML-% IV SOLN: 0.0000 ug/min | INTRAVENOUS | Status: DC

## 2023-09-27 MED ORDER — THIAMINE MONONITRATE 100 MG PO TABS
100.0000 mg | ORAL_TABLET | Freq: Every day | ORAL | Status: DC
  Administered 2023-09-28 – 2023-10-01 (×4): 100 mg via ORAL
  Filled 2023-09-27 (×4): qty 1

## 2023-09-27 MED ORDER — VANCOMYCIN HCL IN DEXTROSE 1-5 GM/200ML-% IV SOLN
1000.0000 mg | Freq: Once | INTRAVENOUS | Status: AC
  Administered 2023-09-27: 1000 mg via INTRAVENOUS
  Filled 2023-09-27: qty 200

## 2023-09-27 MED ORDER — METOPROLOL TARTRATE 25 MG/10 ML ORAL SUSPENSION: 12.5000 mg | Freq: Two times a day (BID) | ORAL | Status: DC

## 2023-09-27 MED ORDER — ONDANSETRON HCL 4 MG/2ML IJ SOLN
INTRAMUSCULAR | Status: AC
  Filled 2023-09-27: qty 2

## 2023-09-27 MED ORDER — DEXMEDETOMIDINE HCL IN NACL 400 MCG/100ML IV SOLN
0.0000 ug/kg/h | INTRAVENOUS | Status: DC
  Administered 2023-09-27: 0.7 ug/kg/h via INTRAVENOUS

## 2023-09-27 MED ORDER — ACETAMINOPHEN 500 MG PO TABS
1000.0000 mg | ORAL_TABLET | Freq: Four times a day (QID) | ORAL | Status: DC
  Administered 2023-09-27 – 2023-09-29 (×9): 1000 mg via ORAL
  Filled 2023-09-27 (×9): qty 2

## 2023-09-27 MED ORDER — ROCURONIUM BROMIDE 10 MG/ML (PF) SYRINGE
PREFILLED_SYRINGE | INTRAVENOUS | Status: AC
  Filled 2023-09-27: qty 10

## 2023-09-27 MED ORDER — PANTOPRAZOLE SODIUM 40 MG PO TBEC
40.0000 mg | DELAYED_RELEASE_TABLET | Freq: Every day | ORAL | Status: DC
  Administered 2023-09-29 – 2023-10-01 (×3): 40 mg via ORAL
  Filled 2023-09-27 (×3): qty 1

## 2023-09-27 MED ORDER — ALBUMIN HUMAN 5 % IV SOLN: INTRAVENOUS | Status: DC | PRN

## 2023-09-27 MED ORDER — SODIUM CHLORIDE 0.9% FLUSH
3.0000 mL | Freq: Two times a day (BID) | INTRAVENOUS | Status: DC
  Administered 2023-09-27: 10 mL via INTRAVENOUS
  Administered 2023-09-27: 3 mL via INTRAVENOUS
  Administered 2023-09-28 (×2): 10 mL via INTRAVENOUS
  Administered 2023-09-29: 3 mL via INTRAVENOUS

## 2023-09-27 MED ORDER — EPHEDRINE 5 MG/ML INJ
INTRAVENOUS | Status: AC
  Filled 2023-09-27: qty 5

## 2023-09-27 MED ORDER — PROTAMINE SULFATE 10 MG/ML IV SOLN
INTRAVENOUS | Status: AC
  Filled 2023-09-27: qty 25

## 2023-09-27 MED ORDER — PROTAMINE SULFATE 10 MG/ML IV SOLN
INTRAVENOUS | Status: DC | PRN
  Administered 2023-09-27: 300 mg via INTRAVENOUS

## 2023-09-27 MED ORDER — FENTANYL CITRATE (PF) 250 MCG/5ML IJ SOLN
INTRAMUSCULAR | Status: DC | PRN
Start: 2023-09-27 — End: 2023-09-27
  Administered 2023-09-27 (×3): 250 ug via INTRAVENOUS
  Administered 2023-09-27: 50 ug via INTRAVENOUS
  Administered 2023-09-27: 200 ug via INTRAVENOUS

## 2023-09-27 MED ORDER — SODIUM BICARBONATE 8.4 % IV SOLN
50.0000 meq | Freq: Once | INTRAVENOUS | Status: AC
  Administered 2023-09-27: 50 meq via INTRAVENOUS

## 2023-09-27 MED ORDER — ASPIRIN 325 MG PO TBEC
325.0000 mg | DELAYED_RELEASE_TABLET | Freq: Every day | ORAL | Status: DC
  Administered 2023-09-28 – 2023-10-01 (×4): 325 mg via ORAL
  Filled 2023-09-27 (×4): qty 1

## 2023-09-27 MED ORDER — THIAMINE HCL 100 MG/ML IJ SOLN
100.0000 mg | Freq: Every day | INTRAMUSCULAR | Status: DC
  Filled 2023-09-27: qty 2

## 2023-09-27 MED ORDER — MIDAZOLAM HCL 2 MG/2ML IJ SOLN: 2.0000 mg | INTRAMUSCULAR | Status: DC | PRN

## 2023-09-27 MED ORDER — FOLIC ACID 1 MG PO TABS
1.0000 mg | ORAL_TABLET | Freq: Every day | ORAL | Status: DC
  Administered 2023-09-28 – 2023-10-01 (×4): 1 mg via ORAL
  Filled 2023-09-27 (×4): qty 1

## 2023-09-27 MED ORDER — HEPARIN SODIUM (PORCINE) 1000 UNIT/ML IJ SOLN
INTRAMUSCULAR | Status: DC | PRN
  Administered 2023-09-27: 2000 [IU] via INTRAVENOUS
  Administered 2023-09-27: 33000 [IU] via INTRAVENOUS

## 2023-09-27 MED ORDER — DEXTROSE 50 % IV SOLN: 0.0000 mL | INTRAVENOUS | Status: DC | PRN

## 2023-09-27 MED ORDER — 0.9 % SODIUM CHLORIDE (POUR BTL) OPTIME
TOPICAL | Status: DC | PRN
  Administered 2023-09-27: 5000 mL

## 2023-09-27 MED ORDER — LACTATED RINGERS IV SOLN: INTRAVENOUS | Status: DC

## 2023-09-27 MED ORDER — CEFAZOLIN SODIUM-DEXTROSE 2-4 GM/100ML-% IV SOLN
2.0000 g | Freq: Three times a day (TID) | INTRAVENOUS | Status: AC
  Administered 2023-09-27 – 2023-09-29 (×6): 2 g via INTRAVENOUS
  Filled 2023-09-27 (×6): qty 100

## 2023-09-27 MED ORDER — LIDOCAINE 2% (20 MG/ML) 5 ML SYRINGE
INTRAMUSCULAR | Status: AC
  Filled 2023-09-27: qty 5

## 2023-09-27 MED ORDER — SODIUM CHLORIDE 0.9 % IV SOLN
INTRAVENOUS | Status: DC | PRN
  Administered 2023-09-27: 2 ug/min via INTRAVENOUS

## 2023-09-27 MED ORDER — ORAL CARE MOUTH RINSE: 15.0000 mL | Freq: Once | OROMUCOSAL | Status: AC

## 2023-09-27 MED ORDER — ASPIRIN 81 MG PO CHEW
324.0000 mg | CHEWABLE_TABLET | Freq: Every day | ORAL | Status: DC
  Filled 2023-09-27: qty 4

## 2023-09-27 MED ORDER — HEPARIN SODIUM (PORCINE) 1000 UNIT/ML IJ SOLN
INTRAMUSCULAR | Status: AC
  Filled 2023-09-27: qty 10

## 2023-09-27 MED ORDER — METOPROLOL TARTRATE 5 MG/5ML IV SOLN
2.5000 mg | INTRAVENOUS | Status: DC | PRN
  Administered 2023-09-28: 2.5 mg via INTRAVENOUS
  Administered 2023-09-28: 5 mg via INTRAVENOUS
  Filled 2023-09-27: qty 5

## 2023-09-27 MED ORDER — POTASSIUM CHLORIDE 10 MEQ/50ML IV SOLN
10.0000 meq | INTRAVENOUS | Status: AC
  Administered 2023-09-27 (×3): 10 meq via INTRAVENOUS

## 2023-09-27 MED ORDER — EPINEPHRINE HCL 5 MG/250ML IV SOLN IN NS: 0.5000 ug/min | INTRAVENOUS | Status: DC

## 2023-09-27 MED ORDER — METOCLOPRAMIDE HCL 5 MG/ML IJ SOLN
10.0000 mg | Freq: Four times a day (QID) | INTRAMUSCULAR | Status: AC
  Administered 2023-09-27 – 2023-09-28 (×5): 10 mg via INTRAVENOUS
  Filled 2023-09-27 (×5): qty 2

## 2023-09-27 MED ORDER — LACTATED RINGERS IV SOLN: INTRAVENOUS | Status: DC | PRN

## 2023-09-27 MED ORDER — INSULIN ASPART 100 UNIT/ML IJ SOLN: 0.0000 [IU] | INTRAMUSCULAR | Status: DC | PRN

## 2023-09-27 MED ORDER — MIDAZOLAM HCL (PF) 10 MG/2ML IJ SOLN
INTRAMUSCULAR | Status: AC
  Filled 2023-09-27: qty 2

## 2023-09-27 MED ORDER — HEPARIN SODIUM (PORCINE) 1000 UNIT/ML IJ SOLN
INTRAMUSCULAR | Status: AC
  Filled 2023-09-27: qty 1

## 2023-09-27 MED ORDER — PROPOFOL 10 MG/ML IV BOLUS
INTRAVENOUS | Status: AC
  Filled 2023-09-27: qty 20

## 2023-09-27 MED ORDER — MIDAZOLAM HCL (PF) 5 MG/ML IJ SOLN
INTRAMUSCULAR | Status: DC | PRN
  Administered 2023-09-27 (×2): 2 mg via INTRAVENOUS
  Administered 2023-09-27: 3 mg via INTRAVENOUS

## 2023-09-27 MED ORDER — PANTOPRAZOLE SODIUM 40 MG IV SOLR
40.0000 mg | Freq: Every day | INTRAVENOUS | Status: AC
  Administered 2023-09-27 – 2023-09-28 (×2): 40 mg via INTRAVENOUS
  Filled 2023-09-27 (×2): qty 10

## 2023-09-27 MED ORDER — VASOPRESSIN 20 UNIT/ML IV SOLN
INTRAVENOUS | Status: AC
  Filled 2023-09-27: qty 1

## 2023-09-27 MED ORDER — SODIUM CHLORIDE 0.9% FLUSH
3.0000 mL | Freq: Two times a day (BID) | INTRAVENOUS | Status: DC
  Administered 2023-09-28 – 2023-09-29 (×3): 3 mL via INTRAVENOUS

## 2023-09-27 SURGICAL SUPPLY — 72 items
BAG DECANTER FOR FLEXI CONT (MISCELLANEOUS) ×4 IMPLANT
BLADE CLIPPER SURG (BLADE) ×4 IMPLANT
BLADE STERNUM SYSTEM 6 (BLADE) ×5 IMPLANT
BLADE SURG 15 STRL LF DISP TIS (BLADE) IMPLANT
BNDG ELASTIC 4INX 5YD STR LF (GAUZE/BANDAGES/DRESSINGS) ×1 IMPLANT
BNDG ELASTIC 6INX 5YD STR LF (GAUZE/BANDAGES/DRESSINGS) ×4 IMPLANT
BNDG GAUZE DERMACEA FLUFF 4 (GAUZE/BANDAGES/DRESSINGS) ×4 IMPLANT
CANNULA EZ GLIDE AORTIC 21FR (CANNULA) ×4 IMPLANT
CANNULA MC2 2 STG 36/46 CONN (CANNULA) ×1 IMPLANT
CATH ROBINSON RED A/P 18FR (CATHETERS) ×4 IMPLANT
CATH THORACIC 36FR (CATHETERS) ×4 IMPLANT
CATH THORACIC 36FR RT ANG (CATHETERS) ×4 IMPLANT
CLIP TI WIDE RED SMALL 24 (CLIP) ×2 IMPLANT
CONTAINER PROTECT SURGISLUSH (MISCELLANEOUS) ×8 IMPLANT
COVER MAYO STAND STRL (DRAPES) IMPLANT
DERMABOND ADVANCED .7 DNX12 (GAUZE/BANDAGES/DRESSINGS) ×1 IMPLANT
DRAPE HALF SHEET 40X57 (DRAPES) IMPLANT
DRAPE SRG 135X102X78XABS (DRAPES) ×4 IMPLANT
DRAPE WARM FLUID 44X44 (DRAPES) ×4 IMPLANT
DRSG COVADERM 4X14 (GAUZE/BANDAGES/DRESSINGS) ×4 IMPLANT
ELECT LEAD UNIPOLAR MYOCARDIAL (ELECTRODE) ×2 IMPLANT
ELECTRODE BLDE 4.0 EZ CLN MEGD (MISCELLANEOUS) ×1 IMPLANT
ELECTRODE REM PT RTRN 9FT ADLT (ELECTROSURGICAL) ×7 IMPLANT
FELT TEFLON 1X6 (MISCELLANEOUS) ×7 IMPLANT
GAUZE 4X4 16PLY ~~LOC~~+RFID DBL (SPONGE) ×4 IMPLANT
GAUZE SPONGE 4X4 12PLY STRL (GAUZE/BANDAGES/DRESSINGS) ×7 IMPLANT
GLOVE SS BIOGEL STRL SZ 7.5 (GLOVE) ×4 IMPLANT
GLOVE SURG SIGNA 7.5 PF LTX (GLOVE) ×12 IMPLANT
GOWN STRL REUS W/ TWL LRG LVL3 (GOWN DISPOSABLE) ×16 IMPLANT
GOWN STRL REUS W/ TWL XL LVL3 (GOWN DISPOSABLE) ×8 IMPLANT
HEMOSTAT POWDER SURGIFOAM 1G (HEMOSTASIS) ×16 IMPLANT
HEMOSTAT SURGICEL 2X14 (HEMOSTASIS) ×5 IMPLANT
KIT BASIN OR (CUSTOM PROCEDURE TRAY) ×4 IMPLANT
KIT CATH SUCT 8FR (CATHETERS) ×1 IMPLANT
KIT SUCTION CATH 14FR (SUCTIONS) ×8 IMPLANT
KIT TURNOVER KIT B (KITS) ×4 IMPLANT
KIT VASOVIEW HEMOPRO 2 VH 4000 (KITS) ×4 IMPLANT
MARKER GRAFT CORONARY BYPASS (MISCELLANEOUS) ×12 IMPLANT
NS IRRIG 1000ML POUR BTL (IV SOLUTION) ×20 IMPLANT
PACK E OPEN HEART (SUTURE) ×4 IMPLANT
PACK OPEN HEART (CUSTOM PROCEDURE TRAY) ×4 IMPLANT
PAD ARMBOARD POSITIONER FOAM (MISCELLANEOUS) ×8 IMPLANT
PAD ELECT DEFIB RADIOL ZOLL (MISCELLANEOUS) ×4 IMPLANT
PENCIL BUTTON HOLSTER BLD 10FT (ELECTRODE) ×4 IMPLANT
POSITIONER HEAD DONUT 9IN (MISCELLANEOUS) ×4 IMPLANT
PUNCH AORTIC ROTATE 4.0MM (MISCELLANEOUS) ×1 IMPLANT
SET MPS 3-ND DEL (MISCELLANEOUS) ×1 IMPLANT
SHEARS HARMONIC 9CM CVD (BLADE) ×3 IMPLANT
SPONGE T-LAP 18X18 ~~LOC~~+RFID (SPONGE) ×1 IMPLANT
SPONGE T-LAP 4X18 ~~LOC~~+RFID (SPONGE) ×4 IMPLANT
SUPPORT HEART JANKE-BARRON (MISCELLANEOUS) ×4 IMPLANT
SUT BONE WAX W31G (SUTURE) ×4 IMPLANT
SUT MNCRL AB 4-0 PS2 18 (SUTURE) ×1 IMPLANT
SUT PROLENE 3 0 SH DA (SUTURE) ×3 IMPLANT
SUT PROLENE 6 0 C 1 30 (SUTURE) ×9 IMPLANT
SUT PROLENE 7 0 BV 1 (SUTURE) ×2 IMPLANT
SUT PROLENE 7 0 BV1 MDA (SUTURE) ×5 IMPLANT
SUT SILK 1 MH (SUTURE) ×1 IMPLANT
SUT STEEL 6MS V (SUTURE) ×5 IMPLANT
SUT STEEL SZ 6 DBL 3X14 BALL (SUTURE) ×4 IMPLANT
SUT VIC AB 1 CTX36XBRD ANBCTR (SUTURE) ×8 IMPLANT
SYSTEM SAHARA CHEST DRAIN ATS (WOUND CARE) ×4 IMPLANT
TAPE CLOTH SURG 4X10 WHT LF (GAUZE/BANDAGES/DRESSINGS) ×1 IMPLANT
TAPE PAPER 2X10 WHT MICROPORE (GAUZE/BANDAGES/DRESSINGS) ×1 IMPLANT
TOWEL GREEN STERILE (TOWEL DISPOSABLE) ×4 IMPLANT
TOWEL GREEN STERILE FF (TOWEL DISPOSABLE) ×4 IMPLANT
TRAY FOLEY SLVR 16FR TEMP STAT (SET/KITS/TRAYS/PACK) ×4 IMPLANT
TUBE SUCT INTRACARD DLP 20F (MISCELLANEOUS) ×1 IMPLANT
TUBING LAP HI FLOW INSUFFLATIO (TUBING) ×4 IMPLANT
UNDERPAD 30X36 HEAVY ABSORB (UNDERPADS AND DIAPERS) ×4 IMPLANT
WATER STERILE IRR 1000ML POUR (IV SOLUTION) ×8 IMPLANT
YANKAUER SUCT BULB TIP NO VENT (SUCTIONS) ×1 IMPLANT

## 2023-09-27 NOTE — Brief Op Note (Signed)
 09/24/2023 - 09/27/2023  10:59 AM  PATIENT:  [name]  63 y.o. male  PRE-OPERATIVE DIAGNOSIS:  CORONARY ARTERY DISEASE  POST-OPERATIVE DIAGNOSIS:  CORONARY ARTERY DISEASE  PROCEDURE:  CORONARY ARTERY BYPASS GRAFTING (CABG) TIMES THREE  UTILIZING LEFT INTERNAL MAMMARY ARTERY AND ENDOSCOPIC VEIN HARVEST GREATER SAPHENOUS VEIN   LIMA-LAD SVG-OM SVG-Distal RCA  Vein harvest time: Vein prep time:  ECHOCARDIOGRAM, TRANSESOPHAGEAL, INTRAOPERATIVE   SURGEON:  Zelphia Higashi, MD   PHYSICIAN ASSISTANT: Ekta Dancer  ASSISTANTS: Maxie Spaniel, RN, Scrub Person  Tami Falcon, RN, Scrub Person   ANESTHESIA:   general  EBL:  BLOOD ADMINISTERED:none  DRAINS:  Left pleural and mediastinal    LOCAL MEDICATIONS USED:  NONE  SPECIMEN:  No Specimen  COUNTS:  Correct  DICTATION: .Dragon Dictation  PLAN OF CARE: Admit to inpatient   PATIENT DISPOSITION:  ICU - intubated and hemodynamically stable.   Delay start of Pharmacological VTE agent (>24hrs) due to surgical blood loss or risk of bleeding: yes

## 2023-09-27 NOTE — Plan of Care (Signed)
  Problem: Education: Goal: Understanding of CV disease, CV risk reduction, and recovery process will improve Outcome: Progressing   Problem: Activity: Goal: Ability to return to baseline activity level will improve Outcome: Progressing   Problem: Cardiovascular: Goal: Ability to achieve and maintain adequate cardiovascular perfusion will improve Outcome: Progressing   Problem: Education: Goal: Knowledge of General Education information will improve Description: Including pain rating scale, medication(s)/side effects and non-pharmacologic comfort measures Outcome: Progressing   Problem: Health Behavior/Discharge Planning: Goal: Ability to manage health-related needs will improve Outcome: Progressing   Problem: Activity: Goal: Risk for activity intolerance will decrease Outcome: Progressing   Problem: Cardiovascular: Goal: Vascular access site(s) Level 0-1 will be maintained Outcome: Completed/Met

## 2023-09-27 NOTE — Anesthesia Postprocedure Evaluation (Signed)
 Anesthesia Post Note  Patient: Paul Dickerson  Procedure(s) Performed: CORONARY ARTERY BYPASS GRAFTING (CABG) TIMES THREE  UTILIZING LEFT INTERNAL MAMMARY ARTERY AND ENDOSCOPIC VEIN HARVEST GREATER SAPHENOUS VEIN (Chest) ECHOCARDIOGRAM, TRANSESOPHAGEAL, INTRAOPERATIVE     Patient location during evaluation: ICU Anesthesia Type: General Level of consciousness: sedated and patient remains intubated per anesthesia plan Pain management: pain level controlled Vital Signs Assessment: post-procedure vital signs reviewed and stable Respiratory status: patient remains intubated per anesthesia plan Cardiovascular status: stable Postop Assessment: no apparent nausea or vomiting Anesthetic complications: no   No notable events documented.  Last Vitals:  Vitals:   09/27/23 1449 09/27/23 1450  BP:    Pulse: 89 88  Resp: 17 17  Temp: (!) 35.7 C (!) 35.7 C  SpO2: 100% 100%    Last Pain:  Vitals:   09/27/23 1319  TempSrc: Core  PainSc:                  Juventino Oppenheim

## 2023-09-27 NOTE — Progress Notes (Signed)
 Patient ID: Paul Dickerson, male   DOB: 06/15/1960, 63 y.o.   MRN: [id number]   TCTS Evening Rounds:   Hemodynamically stable  CI = 2.12 on Epi 4, NE 10, neo 15.  Weaning vent. About ready for extubation.  Urine output good  CT output low  CBC    Component Value Date/Time   WBC 12.5 (H) 09/27/2023 1314   RBC 4.57 09/27/2023 1314   HGB 10.9 (L) 09/27/2023 1314   HGB 14.2 09/20/2023 1638   HCT 35.3 (L) 09/27/2023 1314   HCT 44.2 09/20/2023 1638   PLT 159 09/27/2023 1314   PLT 242 09/20/2023 1638   MCV 77.2 (L) 09/27/2023 1314   MCV 75 (L) 09/20/2023 1638   MCH 23.9 (L) 09/27/2023 1314   MCHC 30.9 09/27/2023 1314   RDW 13.8 09/27/2023 1314   RDW 13.9 09/20/2023 1638   LYMPHSABS 1.3 02/22/2022 1624   MONOABS 0.4 02/22/2022 1624   EOSABS 0.0 02/22/2022 1624   BASOSABS 0.0 02/22/2022 1624     BMET    Component Value Date/Time   NA 136 09/27/2023 1140   NA 134 09/20/2023 1638   K 3.7 09/27/2023 1140   CL 100 09/27/2023 1140   CO2 23 09/20/2023 1638   GLUCOSE 95 09/27/2023 1140   BUN 8 09/27/2023 1140   BUN 7 (L) 09/20/2023 1638   CREATININE 0.80 09/27/2023 1140   CALCIUM  10.2 09/20/2023 1638   EGFR 82 09/20/2023 1638   GFRNONAA >60 02/24/2022 0338     A/P:  Stable postop course. Continue current plans

## 2023-09-27 NOTE — Interval H&P Note (Signed)
 History and Physical Interval Note:  09/27/2023 7:06 AM  Paul Dickerson  has presented today for surgery, with the diagnosis of CAD.  The various methods of treatment have been discussed with the patient and family. After consideration of risks, benefits and other options for treatment, the patient has consented to  Procedure(s): CORONARY ARTERY BYPASS GRAFTING (CABG) (N/A) SURGICAL PROCUREMENT, ARTERY, RADIAL (Left) ECHOCARDIOGRAM, TRANSESOPHAGEAL, INTRAOPERATIVE (N/A) as a surgical intervention.  The patient's history has been reviewed, patient examined, no change in status, stable for surgery.  I have reviewed the patient's chart and labs.  Questions were answered to the patient's satisfaction.     Zelphia Higashi

## 2023-09-27 NOTE — Anesthesia Procedure Notes (Signed)
 Arterial Line Insertion Start/End4/28/2025 6:45 AM, 09/27/2023 6:50 AM Performed by: Juventino Oppenheim, MD, Bennett Brass, CRNA, CRNA  Patient location: Pre-op. Preanesthetic checklist: patient identified, IV checked, site marked, risks and benefits discussed, surgical consent, monitors and equipment checked, pre-op evaluation, timeout performed and anesthesia consent Lidocaine  1% used for infiltration Right, radial was placed Catheter size: 20 G Hand hygiene performed  and maximum sterile barriers used   Attempts: 1 Procedure performed without using ultrasound guided technique. Following insertion, dressing applied. Post procedure assessment: normal and unchanged

## 2023-09-27 NOTE — Anesthesia Procedure Notes (Signed)
 Central Venous Catheter Insertion Performed by: Juventino Oppenheim, MD, anesthesiologist Start/End4/28/2025 6:58 AM, 09/27/2023 7:10 AM Preanesthetic checklist: patient identified, IV checked, risks and benefits discussed, surgical consent, monitors and equipment checked, pre-op evaluation, timeout performed and anesthesia consent Position: Trendelenburg Lidocaine  1% used for infiltration and patient sedated Hand hygiene performed , maximum sterile barriers used  and Seldinger technique used Catheter size: 8.5 Fr Central line was placed.Sheath introducer Procedure performed using ultrasound guided technique. Ultrasound Notes:anatomy identified, needle tip was noted to be adjacent to the nerve/plexus identified, no ultrasound evidence of intravascular and/or intraneural injection and image(s) printed for medical record Attempts: 1 Following insertion, line sutured, dressing applied and Biopatch. Post procedure assessment: blood return through all ports, free fluid flow and no air  Patient tolerated the procedure well with no immediate complications.

## 2023-09-27 NOTE — Anesthesia Procedure Notes (Signed)
 Procedure Name: Intubation Date/Time: 09/27/2023 7:47 AM  Performed by: Bennett Brass, CRNAPre-anesthesia Checklist: Patient identified, Emergency Drugs available, Suction available and Patient being monitored Patient Re-evaluated:Patient Re-evaluated prior to induction Oxygen Delivery Method: Circle system utilized Preoxygenation: Pre-oxygenation with 100% oxygen Induction Type: IV induction Ventilation: Mask ventilation without difficulty Laryngoscope Size: Miller and 2 Grade View: Grade II Tube type: Oral Tube size: 8.0 mm Number of attempts: 2 Airway Equipment and Method: Stylet and Oral airway Placement Confirmation: ETT inserted through vocal cords under direct vision, positive ETCO2 and breath sounds checked- equal and bilateral Secured at: 22 cm Tube secured with: Tape Dental Injury: Teeth and Oropharynx as per pre-operative assessment  Comments: Poor dentition

## 2023-09-27 NOTE — Anesthesia Procedure Notes (Signed)
 Central Venous Catheter Insertion Performed by: Juventino Oppenheim, MD, anesthesiologist Start/End4/28/2025 7:07 AM, 09/27/2023 7:10 AM Patient location: Pre-op. Preanesthetic checklist: patient identified, IV checked, risks and benefits discussed, surgical consent, monitors and equipment checked, pre-op evaluation, timeout performed and anesthesia consent Position: Trendelenburg Hand hygiene performed  and maximum sterile barriers used  Total catheter length 10. PA cath was placed.Swan type:thermodilution PA Cath depth:50 Procedure performed without using ultrasound guided technique. Attempts: 1 Patient tolerated the procedure well with no immediate complications.

## 2023-09-27 NOTE — Hospital Course (Addendum)
 Referring: Paul Benne* Primary Care: Ernestina Headland, MD Primary Cardiologist:Mahesh Miki Alert, MD  Mr. Paul Dickerson is a 63 year old man with multiple cardiac risk factors including type 2 diabetes and hypertension.  He has a known prior stroke and has a history of coronary disease with a previous MI and percutaneous intervention in 2007.  He has been asymptomatic with no chest pain, pressure, tightness, or shortness of breath with activity.  Also denies fatigue.  Recently had a coronary CT which showed calcium  score in the 99th percentile.  Today he underwent cardiac catheterization which revealed 95% left main stenosis and severe three-vessel disease.   LAD is diffusely diseased.  Circumflex appears to be a good target.  RCA with small distal branches likely poor quality target.   Coronary artery bypass grafting is indicated for survival benefit.  I informed him of the general nature of the procedure including the need for general anesthesia, the incisions to be used, the use of cardiopulmonary bypass, and the use of temporary pacemaking wires and drainage tubes postoperatively.  We discussed the expected hospitalization and overall recovery.  I informed him of the indications, risks, benefits, and alternatives.  He understands the risks include, but not limited to MI, DVT, PE, stroke, bleeding, possible need for transfusion, infection, cardiac arrhythmias, respiratory failure, renal failure, as well as possibility of other unforeseeable complications.   He accepts the risks and agrees to proceed.  Hospital Course: Paul Dickerson remained stable following left heart catheterization.  He was taken to the operative room on 09/27/2023 where coronary bypass grafting x 3 was carried out without complication.  The left internal mammary artery was grafted to the left anterior descending coronary artery separate saphenous vein grafts were placed to the obtuse marginal and distal RCA.  Following the  procedure, he separated from cardiopulmonary bypass without difficulty.  He was transferred to the surgical ICU in stable condition.  He remained hemodynamically stable.  He was weaned from the ventilator and extubated routinely.  Monitoring lines and chest tubes were removed on the first postoperative day.  He was mobilized.  He was ready for transfer to [facility] by the second postoperative day.  He was started on low-dose beta-blocker routinely early postoperatively but continued to be mildly hypertensive.  Norvasc  was added.  He was diuresed routinely with appropriate response.  On post-op day 3 he developed supraventricular tachycardia that returned to SR without intervention.  His metoprolol  was increased to 50mg  twice daily.

## 2023-09-27 NOTE — Procedures (Addendum)
 Extubation Procedure Note  Patient Details:   Name: Paul Dickerson DOB: 1961-01-16 MRN: [id number]   Airway Documentation:    Vent end date: 09/27/23 Vent end time: 1803   Evaluation  O2 sats: stable throughout Complications: No apparent complications Patient did tolerate procedure well. Bilateral Breath Sounds: Rhonchi, Diminished   Yes  Gitel Beste 09/27/2023, 6:04 PM Pt extubated to 4L [place] at this time. NIF- -20cmH2o x 3 attempts VC- 1.L x 3 attempts

## 2023-09-27 NOTE — Transfer of Care (Signed)
 Immediate Anesthesia Transfer of Care Note  Patient: Paul Dickerson  Procedure(s) Performed: CORONARY ARTERY BYPASS GRAFTING (CABG) TIMES THREE  UTILIZING LEFT INTERNAL MAMMARY ARTERY AND ENDOSCOPIC VEIN HARVEST GREATER SAPHENOUS VEIN (Chest) ECHOCARDIOGRAM, TRANSESOPHAGEAL, INTRAOPERATIVE  Patient Location: ICU  Anesthesia Type:General  Level of Consciousness: Patient remains intubated per anesthesia plan  Airway & Oxygen Therapy: Patient remains intubated per anesthesia plan and Patient placed on Ventilator (see vital sign flow sheet for setting)  Post-op Assessment: Report given to RN and Post -op Vital signs reviewed and stable  Post vital signs: Reviewed and stable  Last Vitals:  Vitals Value Taken Time  BP 96/79 09/27/23 1305  Temp    Pulse 80 09/27/23 1305  Resp 16 09/27/23 1305  SpO2 98 % 09/27/23 1305    Last Pain:  Vitals:   09/27/23 0638  TempSrc: Oral  PainSc:       Patients Stated Pain Goal: 0 (09/25/23 0800)  Complications: No notable events documented.

## 2023-09-28 ENCOUNTER — Inpatient Hospital Stay (HOSPITAL_COMMUNITY)

## 2023-09-28 ENCOUNTER — Encounter (HOSPITAL_COMMUNITY): Payer: Self-pay | Admitting: Thoracic Surgery (Cardiothoracic Vascular Surgery)

## 2023-09-28 DIAGNOSIS — Z951 Presence of aortocoronary bypass graft: Secondary | ICD-10-CM

## 2023-09-28 LAB — CBC
HCT: 32 % — ABNORMAL LOW (ref 39.0–52.0)
HCT: 34.5 % — ABNORMAL LOW (ref 39.0–52.0)
Hemoglobin: 9.9 g/dL — ABNORMAL LOW (ref 13.0–17.0)
Hemoglobin: 10.5 g/dL — ABNORMAL LOW (ref 13.0–17.0)
MCH: 23.6 pg — ABNORMAL LOW (ref 26.0–34.0)
MCH: 23.5 pg — ABNORMAL LOW (ref 26.0–34.0)
MCHC: 30.9 g/dL (ref 30.0–36.0)
MCHC: 30.4 g/dL (ref 30.0–36.0)
MCV: 76.4 fL — ABNORMAL LOW (ref 80.0–100.0)
MCV: 77.2 fL — ABNORMAL LOW (ref 80.0–100.0)
Platelets: 137 10*3/uL — ABNORMAL LOW (ref 150–400)
Platelets: 122 10*3/uL — ABNORMAL LOW (ref 150–400)
RBC: 4.19 MIL/uL — ABNORMAL LOW (ref 4.22–5.81)
RBC: 4.47 MIL/uL (ref 4.22–5.81)
RDW: 13.9 % (ref 11.5–15.5)
RDW: 13.9 % (ref 11.5–15.5)
WBC: 8.5 10*3/uL (ref 4.0–10.5)
WBC: 8.5 10*3/uL (ref 4.0–10.5)
nRBC: 0 % (ref 0.0–0.2)
nRBC: 0 % (ref 0.0–0.2)

## 2023-09-28 LAB — BASIC METABOLIC PANEL WITH GFR
Anion gap: 7 (ref 5–15)
Anion gap: 11 (ref 5–15)
BUN: 7 mg/dL — ABNORMAL LOW (ref 8–23)
BUN: 9 mg/dL (ref 8–23)
CO2: 22 mmol/L (ref 22–32)
CO2: 23 mmol/L (ref 22–32)
Calcium: 8.1 mg/dL — ABNORMAL LOW (ref 8.9–10.3)
Calcium: 8.8 mg/dL — ABNORMAL LOW (ref 8.9–10.3)
Chloride: 104 mmol/L (ref 98–111)
Chloride: 99 mmol/L (ref 98–111)
Creatinine, Ser: 0.85 mg/dL (ref 0.61–1.24)
Creatinine, Ser: 1.07 mg/dL (ref 0.61–1.24)
GFR, Estimated: >60 mL/min (ref >60)
GFR, Estimated: >60 mL/min (ref >60)
Glucose, Bld: 120 mg/dL — ABNORMAL HIGH (ref 70–99)
Glucose, Bld: 157 mg/dL — ABNORMAL HIGH (ref 70–99)
Potassium: 4.3 mmol/L (ref 3.5–5.1)
Potassium: 4.6 mmol/L (ref 3.5–5.1)
Sodium: 133 mmol/L — ABNORMAL LOW (ref 135–145)
Sodium: 133 mmol/L — ABNORMAL LOW (ref 135–145)

## 2023-09-28 LAB — GLUCOSE, CAPILLARY
Glucose-Capillary: 107 mg/dL — ABNORMAL HIGH (ref 70–99)
Glucose-Capillary: 107 mg/dL — ABNORMAL HIGH (ref 70–99)
Glucose-Capillary: 112 mg/dL — ABNORMAL HIGH (ref 70–99)
Glucose-Capillary: 118 mg/dL — ABNORMAL HIGH (ref 70–99)
Glucose-Capillary: 122 mg/dL — ABNORMAL HIGH (ref 70–99)
Glucose-Capillary: 123 mg/dL — ABNORMAL HIGH (ref 70–99)
Glucose-Capillary: 129 mg/dL — ABNORMAL HIGH (ref 70–99)
Glucose-Capillary: 133 mg/dL — ABNORMAL HIGH (ref 70–99)
Glucose-Capillary: 142 mg/dL — ABNORMAL HIGH (ref 70–99)
Glucose-Capillary: 143 mg/dL — ABNORMAL HIGH (ref 70–99)
Glucose-Capillary: 159 mg/dL — ABNORMAL HIGH (ref 70–99)

## 2023-09-28 LAB — ECHO INTRAOPERATIVE TEE
Height: 71 in
Weight: 3705.6 [oz_av]

## 2023-09-28 LAB — MAGNESIUM
Magnesium: 2.2 mg/dL (ref 1.7–2.4)
Magnesium: 2.1 mg/dL (ref 1.7–2.4)

## 2023-09-28 MED ORDER — INSULIN ASPART 100 UNIT/ML IJ SOLN
0.0000 [IU] | INTRAMUSCULAR | Status: DC
  Administered 2023-09-28 – 2023-09-29 (×4): 2 [IU] via SUBCUTANEOUS

## 2023-09-28 MED ORDER — ENOXAPARIN SODIUM 40 MG/0.4ML IJ SOSY
40.0000 mg | PREFILLED_SYRINGE | Freq: Every day | INTRAMUSCULAR | Status: DC
Start: 2023-09-28 — End: 2023-10-01
  Administered 2023-09-28 – 2023-09-30 (×3): 40 mg via SUBCUTANEOUS
  Filled 2023-09-28 (×3): qty 0.4

## 2023-09-28 MED ORDER — ORAL CARE MOUTH RINSE: 15.0000 mL | OROMUCOSAL | Status: DC | PRN

## 2023-09-28 MED ORDER — INSULIN GLARGINE-YFGN 100 UNIT/ML ~~LOC~~ SOLN
15.0000 [IU] | Freq: Two times a day (BID) | SUBCUTANEOUS | Status: AC
  Administered 2023-09-28 – 2023-09-29 (×3): 15 [IU] via SUBCUTANEOUS
  Filled 2023-09-28 (×4): qty 0.15

## 2023-09-28 MED ORDER — FUROSEMIDE 10 MG/ML IJ SOLN
20.0000 mg | Freq: Two times a day (BID) | INTRAMUSCULAR | Status: DC
  Administered 2023-09-28 – 2023-09-29 (×3): 20 mg via INTRAVENOUS
  Filled 2023-09-28 (×3): qty 2

## 2023-09-28 MED ORDER — AMLODIPINE BESYLATE 10 MG PO TABS
10.0000 mg | ORAL_TABLET | Freq: Every day | ORAL | Status: DC
  Administered 2023-09-28 – 2023-10-01 (×4): 10 mg via ORAL
  Filled 2023-09-28 (×4): qty 1

## 2023-09-28 NOTE — Progress Notes (Signed)
 1 Day Post-Op Procedure(s) (LRB): CORONARY ARTERY BYPASS GRAFTING (CABG) TIMES THREE  UTILIZING LEFT INTERNAL MAMMARY ARTERY AND ENDOSCOPIC VEIN HARVEST GREATER SAPHENOUS VEIN (N/A) ECHOCARDIOGRAM, TRANSESOPHAGEAL, INTRAOPERATIVE (N/A) Subjective: Denies pain, nausea  Objective: Vital signs in last 24 hours: Temp:  [95 F (35 C)-99.3 F (37.4 C)] 98.8 F (37.1 C) (04/29 0730) Pulse Rate:  [72-98] 72 (04/29 0730) Cardiac Rhythm: Atrial paced (04/29 0000) Resp:  [0-27] 22 (04/29 0730) BP: (96-105)/(57-79) 105/57 (04/28 1528) SpO2:  [77 %-100 %] 95 % (04/29 0730) Arterial Line BP: (82-243)/(42-238) 127/53 (04/29 0730) FiO2 (%):  [50 %] 50 % (04/28 1600) Weight:  [108.6 kg] 108.6 kg (04/29 0500)  Hemodynamic parameters for last 24 hours: PAP: (15-97)/(3-24) 35/17 CO:  [4.3 L/min-5.5 L/min] 4.9 L/min CI:  [1.94 L/min/m2-2.5 L/min/m2] 2.2 L/min/m2  Intake/Output from previous day: 04/28 0701 - 04/29 0700 In: 6008.6 [P.O.:90; I.V.:3013.5; Blood:465; IV Piggyback:2440.1] Out: 2414 [Urine:1904; Chest Tube:510] Intake/Output this shift: No intake/output data recorded.  General appearance: alert, cooperative, and no distress Neurologic: intact Heart: regular rate and rhythm Lungs: diminished breath sounds bibasilar Abdomen: soft, hypoactive BS  Lab Results: Recent Labs    09/27/23 1854 09/27/23 1908 09/28/23 0435  WBC 10.6*  --  8.5  HGB 10.0* 10.9* 9.9*  HCT 32.8* 32.0* 32.0*  PLT 158  --  137*   BMET:  Recent Labs    09/27/23 1854 09/27/23 1908 09/28/23 0435  NA 136 138 133*  K 4.1 4.1 4.3  CL 106  --  104  CO2 21*  --  22  GLUCOSE 137*  --  120*  BUN 8  --  7*  CREATININE 1.03  --  0.85  CALCIUM  8.3*  --  8.1*    PT/INR:  Recent Labs    09/27/23 1314  LABPROT 16.9*  INR 1.4*   ABG    Component Value Date/Time   PHART 7.361 09/27/2023 1908   HCO3 22.6 09/27/2023 1908   TCO2 24 09/27/2023 1908   ACIDBASEDEF 3.0 (H) 09/27/2023 1908   O2SAT 99  09/27/2023 1908   CBG (last 3)  Recent Labs    09/28/23 0437 09/28/23 0550 09/28/23 0700  GLUCAP 122* 107* 112*    Assessment/Plan: S/P Procedure(s) (LRB): CORONARY ARTERY BYPASS GRAFTING (CABG) TIMES THREE  UTILIZING LEFT INTERNAL MAMMARY ARTERY AND ENDOSCOPIC VEIN HARVEST GREATER SAPHENOUS VEIN (N/A) ECHOCARDIOGRAM, TRANSESOPHAGEAL, INTRAOPERATIVE (N/A) POD # 1 looks great  NEURO- no focal deficit CV- in Sr with good hemodynamics  ECG shows bifascicular block  Dc Swan and A line  ASA, beta blocker, statin  Hypertensive- resume Norvasc   Restart losartan  tomorrow RESP- IS for atelectasis RENAL- creatinine and lytes OK  Diurese ENDO- CBG well controlled  Transition from insulin  drip to Sem glee + SSI  Resume metformin  tomorrow GI- no nausea but stomach a little distended on CXR  Advance diet as tolerated, Reglan SCD + enoxaparin  Dc chest tubes Cardiac rehab  LOS: 4 days    Paul Dickerson 09/28/2023

## 2023-09-28 NOTE — Progress Notes (Signed)
 EVENING ROUNDS NOTE :     [address]       [place] [id number]             [phone]                 1 Day Post-Op Procedure(s) (LRB): CORONARY ARTERY BYPASS GRAFTING (CABG) TIMES THREE  UTILIZING LEFT INTERNAL MAMMARY ARTERY AND ENDOSCOPIC VEIN HARVEST GREATER SAPHENOUS VEIN (N/A) ECHOCARDIOGRAM, TRANSESOPHAGEAL, INTRAOPERATIVE (N/A)   Total Length of Stay:  LOS: 4 days  Events:   No events Stable day    BP (!) 158/92   Pulse 97   Temp 98.6 F (37 C) (Oral)   Resp (!) 25   Ht 5\' 11"  (1.803 m)   Wt 108.6 kg   SpO2 92%   BMI 33.39 kg/m   PAP: (15-43)/(3-24) 28/15 CO:  [4.3 L/min-4.9 L/min] 4.9 L/min CI:  [1.94 L/min/m2-2.2 L/min/m2] 2.2 L/min/m2      albumin human 999 mL/hr at 09/28/23 1515    ceFAZolin (ANCEF) IV Stopped (09/28/23 1505)    I/O last 3 completed shifts: In: 6008.6 [P.O.:90; I.V.:3013.5; Blood:465; IV Piggyback:2440.1] Out: 2414 [Urine:1904; Chest Tube:510]      Latest Ref Rng & Units 09/28/2023    4:35 AM 09/27/2023    7:08 PM 09/27/2023    6:54 PM  CBC  WBC 4.0 - 10.5 K/uL 8.5   10.6   Hemoglobin 13.0 - 17.0 g/dL 9.9  91.4  78.2   Hematocrit 39.0 - 52.0 % 32.0  32.0  32.8   Platelets 150 - 400 K/uL 137   158        Latest Ref Rng & Units 09/28/2023    4:35 AM 09/27/2023    7:08 PM 09/27/2023    6:54 PM  BMP  Glucose 70 - 99 mg/dL 956   213   BUN 8 - 23 mg/dL 7   8   Creatinine 0.86 - 1.24 mg/dL 5.78   4.69   Sodium 629 - 145 mmol/L 133  138  136   Potassium 3.5 - 5.1 mmol/L 4.3  4.1  4.1   Chloride 98 - 111 mmol/L 104   106   CO2 22 - 32 mmol/L 22   21   Calcium  8.9 - 10.3 mg/dL 8.1   8.3     ABG    Component Value Date/Time   PHART 7.361 09/27/2023 1908   PCO2ART 40.0 09/27/2023 1908   PO2ART 157 (H) 09/27/2023 1908   HCO3 22.6 09/27/2023 1908   TCO2 24 09/27/2023 1908   ACIDBASEDEF 3.0 (H) 09/27/2023 1908   O2SAT 99 09/27/2023 1908       Starleen Eastern, MD 09/28/2023 6:04 PM

## 2023-09-28 NOTE — Plan of Care (Signed)
  Problem: Education: Goal: Understanding of CV disease, CV risk reduction, and recovery process will improve Outcome: Progressing Goal: Individualized Educational Video(s) Outcome: Progressing   Problem: Activity: Goal: Ability to return to baseline activity level will improve Outcome: Progressing   Problem: Cardiovascular: Goal: Ability to achieve and maintain adequate cardiovascular perfusion will improve Outcome: Progressing   Problem: Health Behavior/Discharge Planning: Goal: Ability to safely manage health-related needs after discharge will improve Outcome: Progressing   Problem: Education: Goal: Knowledge of General Education information will improve Description: Including pain rating scale, medication(s)/side effects and non-pharmacologic comfort measures Outcome: Progressing   Problem: Health Behavior/Discharge Planning: Goal: Ability to manage health-related needs will improve Outcome: Progressing   Problem: Clinical Measurements: Goal: Ability to maintain clinical measurements within normal limits will improve Outcome: Progressing Goal: Will remain free from infection Outcome: Progressing Goal: Diagnostic test results will improve Outcome: Progressing Goal: Respiratory complications will improve Outcome: Progressing Goal: Cardiovascular complication will be avoided Outcome: Progressing   Problem: Activity: Goal: Risk for activity intolerance will decrease Outcome: Progressing   Problem: Nutrition: Goal: Adequate nutrition will be maintained Outcome: Progressing   Problem: Coping: Goal: Level of anxiety will decrease Outcome: Progressing   Problem: Elimination: Goal: Will not experience complications related to bowel motility Outcome: Progressing Goal: Will not experience complications related to urinary retention Outcome: Progressing   Problem: Skin Integrity: Goal: Risk for impaired skin integrity will decrease Outcome: Progressing   Problem:  Education: Goal: Will demonstrate proper wound care and an understanding of methods to prevent future damage Outcome: Progressing Goal: Knowledge of disease or condition will improve Outcome: Progressing Goal: Knowledge of the prescribed therapeutic regimen will improve Outcome: Progressing Goal: Individualized Educational Video(s) Outcome: Progressing   Problem: Activity: Goal: Risk for activity intolerance will decrease Outcome: Progressing   Problem: Cardiac: Goal: Will achieve and/or maintain hemodynamic stability Outcome: Progressing   Problem: Clinical Measurements: Goal: Postoperative complications will be avoided or minimized Outcome: Progressing   Problem: Respiratory: Goal: Respiratory status will improve Outcome: Progressing   Problem: Skin Integrity: Goal: Wound healing without signs and symptoms of infection Outcome: Progressing Goal: Risk for impaired skin integrity will decrease Outcome: Progressing   Problem: Urinary Elimination: Goal: Ability to achieve and maintain adequate renal perfusion and functioning will improve Outcome: Progressing

## 2023-09-28 NOTE — Op Note (Signed)
 NAMEARDELL, HEGGE MEDICAL RECORD NO: [id number] ACCOUNT NO: [id number] DATE OF BIRTH: 06-Dec-1960 FACILITY: [facility] LOCATION: [facility] PHYSICIAN: Milon Aloe. Luna Salinas, MD  Operative Report   DATE OF PROCEDURE: 09/27/2023  PREOPERATIVE DIAGNOSIS:  Left main and three-vessel coronary artery disease.  POSTOPERATIVE DIAGNOSIS: Left main and three-vessel coronary artery disease.  PROCEDURE:   Median sternotomy, extracorporeal circulation,  Coronary artery bypass grafting x3  Left internal mammary artery to LAD,  Saphenous vein graft to obtuse marginal 1,  Saphenous vein graft to distal right coronary Endoscopic vein harvest, right leg.  SURGEON:  Milon Aloe. Luna Salinas, MD.  ASSISTANT:  Parris Bolognese, PA.  Experienced assistance necessary for this case due to surgical complexity.  Parris Bolognese assisted with independently harvesting the saphenous vein while I harvested the mammary artery.  He then closed the leg  incisions.  He then assisted with exposure, retraction of delicate tissues, suture management, and suctioning during the anastomosis.  ANESTHESIA:  General.  FINDINGS:  Transesophageal echocardiography showed an ejection fraction of approximately 50% with no significant valvular pathology.  No change post bypass.  CLINICAL NOTE: Mr. Georger is a 63 year old man with multiple cardiac risk factors and known coronary artery disease.  He has had a cardiac CT, which showed 99th percentile coronary calcium  score.  He underwent cardiac catheterization, which showed left main and three-vessel coronary artery disease.  Ejection fraction was estimated to be 35-45%.  He was advised to undergo coronary artery bypass grafting.  The indications, risks, benefits, and alternatives were discussed in detail with the patient.  He understood and accepted the risks and agreed to proceed.  OPERATIVE NOTE: Mr. Gettler was brought to the preoperative holding area on 09/27/2023.  There, the  anesthesia service placed a Swan-Ganz catheter and an arterial blood pressure monitoring line.  Allen's test was repeated with pulse oximetry on the left index finger and showed a dramatic decrease in the pulse oximetry signal and it was not felt the left radial artery would be suitable for use as a bypass graft.  He was taken to the operating room, anesthetized and intubated.  A Foley catheter was placed.  Intravenous antibiotics were administered.  The chest, abdomen, and legs were prepped and draped in the usual sterile fashion.  Dr. Hobart Lulas of anesthesia performed transesophageal echocardiography.  Please see his separately dictated note for full details of that procedure.    A timeout was performed.  A median sternotomy was performed.  The chest wall was very stiff and it was difficult to get a retractor in place, but a Rul-tract retractor was placed and the left internal mammary artery was harvested under direct vision.  Again, this was also difficult due to the lack of compliance of his chest wall.  Simultaneously with the mammary artery harvest, an incision was made in the medial aspect of the right leg at the level of the knee.  The greater saphenous vein was harvested from upper calf to groin endoscopically by Parris Bolognese.  2000 units of heparin  was administered during the vessel harvest.  The remainder of full heparin  dose was given prior to opening the pericardium.  The pericardium was opened.  The ascending aorta was inspected.  After confirming adequate anticoagulation with ACT measurement, the aorta was cannulated via concentric 2-0 bond pledgeted pursestring sutures.  A dual-stage venous cannula was placed via a  pursestring suture in the right atrial appendage.  Cardiopulmonary bypass was initiated.  Flows were maintained per protocol and the patient was cooled  at 32 degrees Celsius.  The coronary arteries were inspected and anastomotic sites were chosen.  The  conduits were  inspected and cut to length.  The foam pad was placed in the pericardium to insulate the heart.  A temperature probe was placed in the myocardial septum and a cardioplegia cannula was placed in the ascending aorta.  The aorta was cross-clamped.  The left ventricle was emptied via the aortic root vent.  Cardiac arrest then was achieved with a combination of cold antegrade blood cardioplegia and topical iced saline.  1.5 L of cardioplegia was administered.  There was a rapid diastolic arrest and septal cooling to 13 degrees Celsius.  A reversed saphenous vein graft was placed end-to-side to the distal right coronary.  The posterior descending and posterolateral branches arose in relatively close proximity, but they were too small to graft directly.  The distal right coronary actually was free of disease at the site of the anastomosis and a probe did pass into the ostium of both vessels.  That was only a 1 mm probe due to the small size of the vessels.  The vein graft was anastomosed end-to-side with a running 7-0 Prolene suture.  A probe passed easily proximally and distally at the completion of the anastomosis.  Cardioplegia was administered.  There was good flow and good hemostasis.  Next, a reversed saphenous vein graft was placed end-to-side to the first obtuse marginal branch of the left circumflex.  The OM was a large dominant lateral branch that gave off numerous branches.  It was grafted in its largest portion beyond the tight stenosis.  It was greater than 2 mm vessel at the site of the anastomosis and was good quality at that site.  The probe did pass distally.  The vein graft was of good quality.  It was anastomosed end-to-side with a running 7-0 Prolene suture.  Again, there was good flow and good hemostasis with cardioplegia administration.  Next, the left internal mammary artery was brought through a window in the pericardium.  The distal end was beveled.  It was a 2 mm good quality conduit.  It  was anastomosed end-to-side to the distal LAD.  The distal LAD was relatively free of disease at the site of the anastomosis, although there was some plaque posteriorly.  It was heavily diseased proximal and distal to the anastomosis and throughout its course.  The mammary to LAD anastomosis was performed with a running 8-0 Prolene suture.  At the completion of the anastomosis, the Bulldog clamp was removed.  Rapid septal rewarming was noted.  The bulldog clamp was replaced and the mammary pedicle was tacked to the epicardial surface of the heart with 6-0 Prolene sutures.  Additional cardioplegia was administered.  The vein grafts were cut to length.  The proximal vein graft anastomosis was performed to 4.0 mm punch aortotomies with running 6-0 Prolene sutures.  At the completion of the final proximal anastomosis, the patient was placed in Trendelenburg position.  Lidocaine  was administered.  The aortic root was de-aired and the aortic cross clamp was removed.  The total cross clamp time was 56 minutes.  While rewarming was completed, all proximal and distal anastomosis were inspected for hemostasis.  Epicardial pacing wires were placed on the right ventricle and right atrium.  The patient required a single defibrillation with 10 joules and then was in a slow sinus rhythm thereafter.  He was DDD paced at the time of separation from bypass.  When the core  temperature reached 37 degrees Celsius, the patient was weaned from cardiopulmonary bypass on the first attempt.  He was on low-dose epinephrine  and norepinephrine  infusions due to low blood pressure.  Post bypass transesophageal echocardiography showed preserved left ventricular wall motion, but the ventricle was relatively underfilled.  Fluid resuscitation was initiated.  The initial cardiac index was approximately 1.5 liters per minute per meter squared, but the index improved with volume resuscitation.  A test dose of protamine  was administered and was well  tolerated.  The atrial and aortic cannula were removed.  The remainder of protamine  was administered without incident.  The chest was irrigated with warm saline.  Hemostasis was achieved.  The left pleural and mediastinal chest tube was placed through separate subcostal incisions and secured with #1 silk sutures.  The pericardium was  reapproximated over the ascending aorta and base of the heart with interrupted 3-0 silk sutures.  The sternum was closed with a combination of single and double heavy gauge stainless steel wires, pectoralis fascia and subcutaneous tissue, and the skin were closed in standard fashion.  All sponge, needle, and instrument counts were correct at the end of the procedure.  The patient was taken from the operating room to the surgical intensive care unit, intubated and in good condition.   PUS D: 09/27/2023 5:37:29 pm T: 09/28/2023 6:32:00 am  JOB: [id number]/ [id number]

## 2023-09-29 ENCOUNTER — Inpatient Hospital Stay (HOSPITAL_COMMUNITY)

## 2023-09-29 LAB — CBC
HCT: 30 % — ABNORMAL LOW (ref 39.0–52.0)
Hemoglobin: 9.3 g/dL — ABNORMAL LOW (ref 13.0–17.0)
MCH: 23.8 pg — ABNORMAL LOW (ref 26.0–34.0)
MCHC: 31 g/dL (ref 30.0–36.0)
MCV: 76.7 fL — ABNORMAL LOW (ref 80.0–100.0)
Platelets: 125 10*3/uL — ABNORMAL LOW (ref 150–400)
RBC: 3.91 MIL/uL — ABNORMAL LOW (ref 4.22–5.81)
RDW: 13.9 % (ref 11.5–15.5)
WBC: 7.3 10*3/uL (ref 4.0–10.5)
nRBC: 0 % (ref 0.0–0.2)

## 2023-09-29 LAB — BASIC METABOLIC PANEL WITH GFR
Anion gap: 9 (ref 5–15)
BUN: 9 mg/dL (ref 8–23)
CO2: 21 mmol/L — ABNORMAL LOW (ref 22–32)
Calcium: 8 mg/dL — ABNORMAL LOW (ref 8.9–10.3)
Chloride: 104 mmol/L (ref 98–111)
Creatinine, Ser: 0.87 mg/dL (ref 0.61–1.24)
GFR, Estimated: >60 mL/min (ref >60)
Glucose, Bld: 106 mg/dL — ABNORMAL HIGH (ref 70–99)
Potassium: 3.6 mmol/L (ref 3.5–5.1)
Sodium: 134 mmol/L — ABNORMAL LOW (ref 135–145)

## 2023-09-29 LAB — GLUCOSE, CAPILLARY
Glucose-Capillary: 122 mg/dL — ABNORMAL HIGH (ref 70–99)
Glucose-Capillary: 188 mg/dL — ABNORMAL HIGH (ref 70–99)
Glucose-Capillary: 171 mg/dL — ABNORMAL HIGH (ref 70–99)
Glucose-Capillary: 147 mg/dL — ABNORMAL HIGH (ref 70–99)
Glucose-Capillary: 147 mg/dL — ABNORMAL HIGH (ref 70–99)

## 2023-09-29 MED ORDER — SODIUM CHLORIDE 0.9 % IV SOLN: 250.0000 mL | INTRAVENOUS | Status: AC | PRN

## 2023-09-29 MED ORDER — INSULIN ASPART 100 UNIT/ML IJ SOLN: 0.0000 [IU] | Freq: Three times a day (TID) | INTRAMUSCULAR | Status: DC

## 2023-09-29 MED ORDER — POTASSIUM CHLORIDE CRYS ER 20 MEQ PO TBCR
40.0000 meq | EXTENDED_RELEASE_TABLET | Freq: Once | ORAL | Status: AC
  Administered 2023-09-29: 40 meq via ORAL
  Filled 2023-09-29: qty 2

## 2023-09-29 MED ORDER — INSULIN ASPART 100 UNIT/ML IJ SOLN: 0.0000 [IU] | INTRAMUSCULAR | Status: DC

## 2023-09-29 MED ORDER — METFORMIN HCL ER 500 MG PO TB24
500.0000 mg | ORAL_TABLET | Freq: Every day | ORAL | Status: DC
  Administered 2023-09-30: 500 mg via ORAL
  Filled 2023-09-29: qty 1

## 2023-09-29 MED ORDER — METOPROLOL TARTRATE 25 MG PO TABS
25.0000 mg | ORAL_TABLET | Freq: Two times a day (BID) | ORAL | Status: DC
  Administered 2023-09-29: 25 mg via ORAL
  Filled 2023-09-29: qty 1

## 2023-09-29 MED ORDER — MAGNESIUM HYDROXIDE 400 MG/5ML PO SUSP: 30.0000 mL | Freq: Every day | ORAL | Status: DC | PRN

## 2023-09-29 MED ORDER — ALUM & MAG HYDROXIDE-SIMETH 200-200-20 MG/5ML PO SUSP: 15.0000 mL | Freq: Four times a day (QID) | ORAL | Status: DC | PRN

## 2023-09-29 MED ORDER — POTASSIUM CHLORIDE CRYS ER 20 MEQ PO TBCR
20.0000 meq | EXTENDED_RELEASE_TABLET | Freq: Every day | ORAL | Status: DC
  Administered 2023-09-29 – 2023-10-01 (×3): 20 meq via ORAL
  Filled 2023-09-29: qty 2
  Filled 2023-09-29 (×2): qty 1
  Filled 2023-09-29: qty 2

## 2023-09-29 MED ORDER — SODIUM CHLORIDE 0.9% FLUSH
3.0000 mL | Freq: Two times a day (BID) | INTRAVENOUS | Status: DC
  Administered 2023-09-29 – 2023-09-30 (×4): 3 mL via INTRAVENOUS

## 2023-09-29 MED ORDER — INSULIN ASPART 100 UNIT/ML IJ SOLN
0.0000 [IU] | Freq: Three times a day (TID) | INTRAMUSCULAR | Status: DC
  Administered 2023-09-29: 2 [IU] via SUBCUTANEOUS
  Administered 2023-09-29: 4 [IU] via SUBCUTANEOUS
  Administered 2023-09-29: 2 [IU] via SUBCUTANEOUS
  Administered 2023-09-29: 4 [IU] via SUBCUTANEOUS
  Administered 2023-09-30: 2 [IU] via SUBCUTANEOUS
  Administered 2023-09-30: 4 [IU] via SUBCUTANEOUS
  Administered 2023-09-30: 2 [IU] via SUBCUTANEOUS

## 2023-09-29 MED ORDER — SODIUM CHLORIDE 0.9% FLUSH: 3.0000 mL | INTRAVENOUS | Status: DC | PRN

## 2023-09-29 MED ORDER — METOPROLOL TARTRATE 25 MG/10 ML ORAL SUSPENSION
25.0000 mg | Freq: Two times a day (BID) | ORAL | Status: DC
  Filled 2023-09-29 (×2): qty 10

## 2023-09-29 MED ORDER — FUROSEMIDE 40 MG PO TABS
40.0000 mg | ORAL_TABLET | Freq: Every day | ORAL | Status: DC
  Administered 2023-09-30 – 2023-10-01 (×2): 40 mg via ORAL
  Filled 2023-09-29 (×2): qty 1

## 2023-09-29 MED ORDER — ~~LOC~~ CARDIAC SURGERY, PATIENT & FAMILY EDUCATION: Freq: Once | Status: DC

## 2023-09-29 MED FILL — Sodium Bicarbonate IV Soln 8.4%: INTRAVENOUS | Qty: 50 | Status: AC

## 2023-09-29 MED FILL — Lidocaine HCl Local Soln Prefilled Syringe 100 MG/5ML (2%): INTRAMUSCULAR | Qty: 5 | Status: AC

## 2023-09-29 MED FILL — Heparin Sodium (Porcine) Inj 1000 Unit/ML: INTRAMUSCULAR | Qty: 10 | Status: AC

## 2023-09-29 MED FILL — Electrolyte-R (PH 7.4) Solution: INTRAVENOUS | Qty: 5000 | Status: AC

## 2023-09-29 MED FILL — Potassium Chloride Inj 2 mEq/ML: INTRAVENOUS | Qty: 20 | Status: AC

## 2023-09-29 MED FILL — Sodium Chloride IV Soln 0.9%: INTRAVENOUS | Qty: 2000 | Status: AC

## 2023-09-29 MED FILL — Magnesium Sulfate Inj 50%: INTRAMUSCULAR | Qty: 10 | Status: AC

## 2023-09-29 MED FILL — Mannitol IV Soln 20%: INTRAVENOUS | Qty: 500 | Status: AC

## 2023-09-29 MED FILL — Heparin Sodium (Porcine) Inj 1000 Unit/ML: Qty: 1000 | Status: AC

## 2023-09-29 MED FILL — Potassium Chloride Inj 2 mEq/ML: INTRAVENOUS | Qty: 40 | Status: AC

## 2023-09-29 MED FILL — Calcium Chloride Inj 10%: INTRAVENOUS | Qty: 10 | Status: AC

## 2023-09-29 NOTE — Progress Notes (Addendum)
 [facility] DAILY ICU PROGRESS NOTE                   [address]            [place] [id number]          [phone]   2 Days Post-Op Procedure(s) (LRB): CORONARY ARTERY BYPASS GRAFTING (CABG) TIMES THREE  UTILIZING LEFT INTERNAL MAMMARY ARTERY AND ENDOSCOPIC VEIN HARVEST GREATER SAPHENOUS VEIN (N/A) ECHOCARDIOGRAM, TRANSESOPHAGEAL, INTRAOPERATIVE (N/A)  Total Length of Stay:  LOS: 5 days   Subjective: Looks and feels well  Objective: Vital signs in last 24 hours: Temp:  [98.4 F (36.9 C)-99 F (37.2 C)] 98.5 F (36.9 C) (04/30 0336) Pulse Rate:  [75-97] 84 (04/30 0700) Cardiac Rhythm: Normal sinus rhythm (04/30 0400) Resp:  [8-27] 20 (04/30 0700) BP: (104-158)/(67-92) 127/81 (04/30 0700) SpO2:  [76 %-97 %] 97 % (04/30 0700) Arterial Line BP: (135-144)/(55-57) 135/55 (04/29 0830) Weight:  [107.5 kg] 107.5 kg (04/30 0500)  Filed Weights   09/27/23 0638 09/28/23 0500 09/29/23 0500  Weight: 104.3 kg 108.6 kg 107.5 kg    Weight change: -1.1 kg   Hemodynamic parameters for last 24 hours: PAP: (28)/(15) 28/15  Intake/Output from previous day: 04/29 0701 - 04/30 0700 In: 238.1 [I.V.:138.1; IV Piggyback:100] Out: 2300 [Urine:2300]  Intake/Output this shift: No intake/output data recorded.  Current Meds: Scheduled Meds:  acetaminophen  1,000 mg Oral Q6H   Or   acetaminophen (TYLENOL) oral liquid 160 mg/5 mL  1,000 mg Per Tube Q6H   amLODipine   10 mg Oral Daily   aspirin  EC  325 mg Oral Daily   Or   aspirin   324 mg Per Tube Daily   atorvastatin   80 mg Oral Daily   bisacodyl  10 mg Oral Daily   Or   bisacodyl  10 mg Rectal Daily   Chlorhexidine Gluconate Cloth  6 each Topical Daily   docusate sodium  200 mg Oral Daily   enoxaparin  (LOVENOX ) injection  40 mg Subcutaneous QHS   folic acid   1 mg Oral Daily   furosemide  20 mg Intravenous BID   insulin  aspart  0-24 Units Subcutaneous TID AC & HS   insulin  glargine-yfgn  15 Units Subcutaneous BID    metoprolol  tartrate  12.5 mg Oral BID   Or   metoprolol  tartrate  12.5 mg Per Tube BID   pantoprazole  40 mg Oral Daily   sodium chloride  flush  3 mL Intravenous Q12H   sodium chloride  flush  3-10 mL Intravenous Q12H   sodium chloride  flush  3-10 mL Intravenous Q12H   sodium chloride  flush  3-10 mL Intravenous Q12H   thiamine   100 mg Oral Daily   Or   thiamine   100 mg Intravenous Daily   Continuous Infusions:  albumin human 999 mL/hr at 09/28/23 2000   PRN Meds:.albumin human, dextrose, metoprolol  tartrate, midazolam , morphine injection, ondansetron (ZOFRAN) IV, mouth rinse, oxyCODONE, sodium chloride  flush, sodium chloride  flush, sodium chloride  flush, sodium chloride  flush, traMADol  General appearance: alert, cooperative, and no distress Heart: regular rate and rhythm Lungs: mildly dim in bases Abdomen: benign Extremities: no signif edema Wound: chest dressing CDI, EVH site healing well  Lab Results: CBC: Recent Labs    09/28/23 1922 09/29/23 0524  WBC 8.5 7.3  HGB 10.5* 9.3*  HCT 34.5* 30.0*  PLT 122* 125*   BMET:  Recent Labs    09/28/23 1800 09/29/23 0524  NA 133* 134*  K 4.6  3.6  CL 99 104  CO2 23 21*  GLUCOSE 157* 106*  BUN 9 9  CREATININE 1.07 0.87  CALCIUM  8.8* 8.0*    CMET: Lab Results  Component Value Date   WBC 7.3 09/29/2023   HGB 9.3 (L) 09/29/2023   HCT 30.0 (L) 09/29/2023   PLT 125 (L) 09/29/2023   GLUCOSE 106 (H) 09/29/2023   CHOL 177 01/19/2023   TRIG 61 01/19/2023   HDL 64 01/19/2023   LDLCALC 101 (H) 01/19/2023   ALT 46 (H) 02/24/2022   AST 41 02/24/2022   NA 134 (L) 09/29/2023   K 3.6 09/29/2023   CL 104 09/29/2023   CREATININE 0.87 09/29/2023   BUN 9 09/29/2023   CO2 21 (L) 09/29/2023   TSH 1.515 02/23/2022   INR 1.4 (H) 09/27/2023   HGBA1C 7.0 (H) 09/26/2023      PT/INR:  Recent Labs    09/27/23 1314  LABPROT 16.9*  INR 1.4*   Radiology: No results found.   Assessment/Plan: S/P Procedure(s) (LRB): CORONARY  ARTERY BYPASS GRAFTING (CABG) TIMES THREE  UTILIZING LEFT INTERNAL MAMMARY ARTERY AND ENDOSCOPIC VEIN HARVEST GREATER SAPHENOUS VEIN (N/A) ECHOCARDIOGRAM, TRANSESOPHAGEAL, INTRAOPERATIVE (N/A) POD#2  1 Afeb, S bp 100's- 150's, normal sinus rhythm- norvasc  started 2 O2 sats good on RA 3 good UOP, weight up approx 3 kg from preop, lasix initiated IV this am 4 normal renal fxn 5 expected ABLA, lower but not in transfusion threshold, likely to equilibrate w/ diuresis 6 reactive thrombocytopenia, slightly improved trend, should normalize over next few days 7 CXR- minor atx, small left effusion 8 CBG - good control- usual protocols- resume glucophage  at some point likely prior to D/C 9 lovenox  for DVT ppx- monitoring platelets 10 usual pulm hygiene and cardiac rehab modalities 11 tx to 4e    Lindi Revering PA-C Pager [phone] 09/29/2023 8:00 AM  Patient seen and examined, agree with above Looks great.  Will transfer to 4E tele bed  Landon Pinion C. Luna Salinas, MD [facility] [phone]

## 2023-09-29 NOTE — Discharge Summary (Signed)
 Physician Discharge Summary  Patient ID: Paul Dickerson MRN: [id number] DOB/AGE: 1960/08/26 63 y.o.  Admit date: 09/24/2023 Discharge date: 10/01/2023  Admission Diagnoses: Multivessel coronary artery disease Type 2 diabetes mellitus Hypertension History of stroke  Discharge Diagnoses:  Multivessel coronary artery disease Type 2 diabetes mellitus Hypertension History of stroke S/P CABG x 3  Discharged Condition: stable  Referring: Odie Benne* Primary Care: Ernestina Headland, MD Primary Cardiologist:Mahesh Miki Alert, MD  [name] is a 63 year old man with multiple cardiac risk factors including type 2 diabetes and hypertension.  He has a known prior stroke and has a history of coronary disease with a previous MI and percutaneous intervention in 2007.  He has been asymptomatic with no chest pain, pressure, tightness, or shortness of breath with activity.  Also denies fatigue.  Recently had a coronary CT which showed calcium  score in the 99th percentile.  Today he underwent cardiac catheterization which revealed 95% left main stenosis and severe three-vessel disease.   LAD is diffusely diseased.  Circumflex appears to be a good target.  RCA with small distal branches likely poor quality target.   Coronary artery bypass grafting is indicated for survival benefit.  I informed him of the general nature of the procedure including the need for general anesthesia, the incisions to be used, the use of cardiopulmonary bypass, and the use of temporary pacemaking wires and drainage tubes postoperatively.  We discussed the expected hospitalization and overall recovery.  I informed him of the indications, risks, benefits, and alternatives.  He understands the risks include, but not limited to MI, DVT, PE, stroke, bleeding, possible need for transfusion, infection, cardiac arrhythmias, respiratory failure, renal failure, as well as possibility of other unforeseeable complications.    He accepts the risks and agrees to proceed.  Hospital Course: [name] remained stable following left heart catheterization.  He was taken to the operative room on 09/27/2023 where coronary bypass grafting x 3 was carried out without complication.  The left internal mammary artery was grafted to the left anterior descending coronary artery separate saphenous vein grafts were placed to the obtuse marginal and distal RCA.  Following the procedure, he separated from cardiopulmonary bypass without difficulty.  He was transferred to the surgical ICU in stable condition.  He remained hemodynamically stable.  He was weaned from the ventilator and extubated routinely.  Monitoring lines and chest tubes were removed on the first postoperative day.  He was mobilized.  He was ready for transfer to 4E Progressive Care by the second postoperative day.  He was started on low-dose beta-blocker routinely early postoperatively but continued to be mildly hypertensive.  Norvasc  was added.  He was diuresed routinely with appropriate response.  By the fourth postoperative day, he was completely independent with mobility and transfers.  He was tolerating a diet without difficulty and having appropriate bowel and bladder function.  Incisions were healing with no sign of complication.  He felt he was ready to return home and also appeared ready for discharge from clinical standpoint.  Consults: None  Significant Diagnostic Studies: CLINICAL DATA:  Status post CABG.   EXAM: CHEST - 2 VIEW   COMPARISON:  AP chest 09/29/2023 and 09/28/2023   FINDINGS: Interval removal of prior right internal jugular central venous catheter sheath. Status post median sternotomy and CABG. Small left pleural effusion is similar to prior. Streaky likely subsegmental atelectasis overlying the right mid to lower lung is similar to prior. No pneumothorax. Moderate multilevel degenerative disc changes of the  thoracic spine.   IMPRESSION: 1.  Status post median sternotomy and CABG. 2. Small left pleural effusion is similar to prior. 3. Streaky likely subsegmental atelectasis overlying the right mid to lower lung, similar to prior.     Electronically Signed   By: Bertina Broccoli M.D.   On: 09/30/2023 08:42  Treatments: Operative Report    DATE OF PROCEDURE: 09/27/2023   PREOPERATIVE DIAGNOSIS:  Left main and three-vessel coronary artery disease.   POSTOPERATIVE DIAGNOSIS: Left main and three-vessel coronary artery disease.   PROCEDURE:  Median sternotomy, extracorporeal circulation, coronary artery bypass grafting x3 (left internal mammary artery to LAD, saphenous vein graft to obtuse marginal 1, saphenous vein graft to distal right coronary) endoscopic vein harvest, right  leg.   SURGEON:  Milon Aloe. Luna Salinas, MD.   ASSISTANT:  Parris Bolognese, PA. Experienced assistance necessary for this case due to surgical complexity.  Parris Bolognese assisted with independently harvesting the saphenous vein while I harvested the mammary artery.  He then closed the leg  incisions.  He then assisted with exposure, retraction of delicate tissues, suture management, and suctioning during the anastomosis.   ANESTHESIA:  General.   FINDINGS:  Transesophageal echocardiography showed an ejection fraction of approximately 50% with no significant valvular pathology.  No change post bypass.  Discharge Exam: Blood pressure 121/81, pulse 79, temperature 99.3 F (37.4 C), resp. rate 20, height 5\' 11"  (1.803 m), weight 105.7 kg, SpO2 96%. General appearance: alert, cooperative, and no distress Heart: regular rate and rhythm.  Monitor review shows sinus rhythm with no further SVT.  He does have occasional PVCs. Lungs: clear to auscultation bilaterally Abdomen: benign Extremities: No peripheral edema  Wound: Sternotomy incision and right lower extremity EVH incisions are dry and intact.  Disposition: Discharged home in stable  condition   Allergies as of 10/01/2023   No Known Allergies      Medication List     STOP taking these medications    losartan  100 MG tablet Commonly known as: COZAAR        TAKE these medications    acetaminophen  325 MG tablet Commonly known as: TYLENOL  Take 2 tablets (650 mg total) by mouth every 6 (six) hours as needed.   amLODipine  5 MG tablet Commonly known as: NORVASC  TAKE 1 TAB BY MOUTH AT BEDTIME   Aspirin  Low Dose 81 MG tablet Generic drug: aspirin  EC Take 1 tablet (81 mg total) by mouth daily. Swallow whole.   atorvastatin  80 MG tablet Commonly known as: LIPITOR  Take 1 tablet (80 mg total) by mouth daily.   folic acid  1 MG tablet Commonly known as: FOLVITE  Take 1 tablet (1 mg total) by mouth daily.   hydrochlorothiazide  25 MG tablet Commonly known as: HYDRODIURIL  TAKE 1 TABLET BY MOUTH EVERY DAY   metFORMIN  500 MG 24 hr tablet Commonly known as: GLUCOPHAGE -XR TAKE 1 TABLET BY MOUTH EVERY DAY WITH BREAKFAST   metoprolol  tartrate 50 MG tablet Commonly known as: LOPRESSOR  Take 1 tablet (50 mg total) by mouth 2 (two) times daily.   oxyCODONE  5 MG immediate release tablet Commonly known as: Oxy IR/ROXICODONE  Take 1 tablet (5 mg total) by mouth every 6 (six) hours as needed for severe pain (pain score 7-10).   thiamine  100 MG tablet Commonly known as: VITAMIN B1 Take 1 tablet (100 mg total) by mouth daily.        Follow-up Information     Valentina Gasman Karon Packer., NP. Go on 10/14/2023.   Specialty: Cardiology Why: Your  appointment is at 10:55am. Contact information: [address] [place] [address] [phone]         [facility] [employer]. Go on 11/02/2023.   Specialty: Cardiothoracic Surgery Why: Your appointment is at 10:45 AM.  Please arrive about 45 minutes early for chest x-ray to be done prior to the appointment. Contact information: [address] [place]  [phone]                The patient has been discharged on:   1.Beta Blocker:  Yes [x ]                              No   [   ]                              If No, reason:  2.Ace Inhibitor/ARB: Yes [   ]                                     No  [  x  ]                                     If No, reason:  3.Statin:   Yes [ x ]                  No  [   ]                  If No, reason:  4.Ecasa:  Yes  [ x ]                  No   [   ]                  If No, reason:  5. ACS on Admission? no  P2Y12 Inhibitor:  Yes  [   ]                                No  [x ]    Signed: Leata Providence, PA-C 10/01/2023, 8:20 AM

## 2023-09-29 NOTE — Plan of Care (Signed)
  Problem: Activity: Goal: Ability to return to baseline activity level will improve Outcome: Progressing   Problem: Health Behavior/Discharge Planning: Goal: Ability to manage health-related needs will improve Outcome: Progressing   Problem: Clinical Measurements: Goal: Will remain free from infection Outcome: Progressing Goal: Diagnostic test results will improve Outcome: Progressing Goal: Respiratory complications will improve Outcome: Progressing

## 2023-09-30 ENCOUNTER — Inpatient Hospital Stay (HOSPITAL_COMMUNITY)

## 2023-09-30 LAB — CBC
HCT: 31.5 % — ABNORMAL LOW (ref 39.0–52.0)
Hemoglobin: 9.8 g/dL — ABNORMAL LOW (ref 13.0–17.0)
MCH: 23.6 pg — ABNORMAL LOW (ref 26.0–34.0)
MCHC: 31.1 g/dL (ref 30.0–36.0)
MCV: 75.7 fL — ABNORMAL LOW (ref 80.0–100.0)
Platelets: 184 10*3/uL (ref 150–400)
RBC: 4.16 MIL/uL — ABNORMAL LOW (ref 4.22–5.81)
RDW: 13.5 % (ref 11.5–15.5)
WBC: 7.2 10*3/uL (ref 4.0–10.5)
nRBC: 0 % (ref 0.0–0.2)

## 2023-09-30 LAB — BASIC METABOLIC PANEL WITH GFR
Anion gap: 11 (ref 5–15)
BUN: 11 mg/dL (ref 8–23)
CO2: 21 mmol/L — ABNORMAL LOW (ref 22–32)
Calcium: 8.9 mg/dL (ref 8.9–10.3)
Chloride: 99 mmol/L (ref 98–111)
Creatinine, Ser: 0.99 mg/dL (ref 0.61–1.24)
GFR, Estimated: >60 mL/min (ref >60)
Glucose, Bld: 193 mg/dL — ABNORMAL HIGH (ref 70–99)
Potassium: 4.1 mmol/L (ref 3.5–5.1)
Sodium: 131 mmol/L — ABNORMAL LOW (ref 135–145)

## 2023-09-30 LAB — GLUCOSE, CAPILLARY
Glucose-Capillary: 121 mg/dL — ABNORMAL HIGH (ref 70–99)
Glucose-Capillary: 152 mg/dL — ABNORMAL HIGH (ref 70–99)
Glucose-Capillary: 174 mg/dL — ABNORMAL HIGH (ref 70–99)
Glucose-Capillary: 150 mg/dL — ABNORMAL HIGH (ref 70–99)

## 2023-09-30 LAB — MAGNESIUM: Magnesium: 2 mg/dL (ref 1.7–2.4)

## 2023-09-30 MED ORDER — METOPROLOL TARTRATE 25 MG/10 ML ORAL SUSPENSION
50.0000 mg | Freq: Two times a day (BID) | ORAL | Status: DC
  Filled 2023-09-30 (×3): qty 20

## 2023-09-30 MED ORDER — METOPROLOL TARTRATE 50 MG PO TABS
50.0000 mg | ORAL_TABLET | Freq: Two times a day (BID) | ORAL | Status: DC
Start: 2023-09-30 — End: 2023-10-01
  Administered 2023-09-30 – 2023-10-01 (×3): 50 mg via ORAL
  Filled 2023-09-30 (×3): qty 1

## 2023-09-30 NOTE — Progress Notes (Signed)
 Pt ambulated x 300 feet with front wheel walker, taking short steady steps, pt tolerated well

## 2023-09-30 NOTE — Progress Notes (Addendum)
 3 Days Post-Op Procedure(s) (LRB): CORONARY ARTERY BYPASS GRAFTING (CABG) TIMES THREE  UTILIZING LEFT INTERNAL MAMMARY ARTERY AND ENDOSCOPIC VEIN HARVEST GREATER SAPHENOUS VEIN (N/A) ECHOCARDIOGRAM, TRANSESOPHAGEAL, INTRAOPERATIVE (N/A) Subjective: Feels pretty well, no palpitations when tachy  Objective: Vital signs in last 24 hours: Temp:  [97.5 F (36.4 C)-99.2 F (37.3 C)] 98.5 F (36.9 C) (05/01 0353) Pulse Rate:  [77-104] 82 (05/01 0353) Cardiac Rhythm: Sinus tachycardia (04/30 2000) Resp:  [17-28] 17 (05/01 0353) BP: (112-142)/(71-91) 122/75 (05/01 0353) SpO2:  [81 %-99 %] 99 % (05/01 0353) Weight:  [106.1 kg] 106.1 kg (05/01 0353)  Hemodynamic parameters for last 24 hours:    Intake/Output from previous day: 04/30 0701 - 05/01 0700 In: 120 [P.O.:120] Out: 745 [Urine:745] Intake/Output this shift: No intake/output data recorded.  General appearance: alert, cooperative, and no distress Heart: regular rate and rhythm Lungs: clear to auscultation bilaterally Abdomen: benign Extremities: minor edema Wound: incis healing well  Lab Results: Recent Labs    09/28/23 1922 09/29/23 0524  WBC 8.5 7.3  HGB 10.5* 9.3*  HCT 34.5* 30.0*  PLT 122* 125*   BMET:  Recent Labs    09/28/23 1800 09/29/23 0524  NA 133* 134*  K 4.6 3.6  CL 99 104  CO2 23 21*  GLUCOSE 157* 106*  BUN 9 9  CREATININE 1.07 0.87  CALCIUM  8.8* 8.0*    PT/INR:  Recent Labs    09/27/23 1314  LABPROT 16.9*  INR 1.4*   ABG    Component Value Date/Time   PHART 7.361 09/27/2023 1908   HCO3 22.6 09/27/2023 1908   TCO2 24 09/27/2023 1908   ACIDBASEDEF 3.0 (H) 09/27/2023 1908   O2SAT 99 09/27/2023 1908   CBG (last 3)  Recent Labs    09/29/23 1825 09/29/23 2117 09/30/23 0556  GLUCAP 147* 147* 121*    Meds Scheduled Meds:  acetaminophen   1,000 mg Oral Q6H   Or   acetaminophen  (TYLENOL ) oral liquid 160 mg/5 mL  1,000 mg Per Tube Q6H   amLODipine   10 mg Oral Daily   aspirin  EC   325 mg Oral Daily   Or   aspirin   324 mg Per Tube Daily   atorvastatin   80 mg Oral Daily   bisacodyl   10 mg Oral Daily   Or   bisacodyl   10 mg Rectal Daily   Chlorhexidine  Gluconate Cloth  6 each Topical Daily   [facility], Patient & Family Education   Does not apply Once   docusate sodium   200 mg Oral Daily   enoxaparin  (LOVENOX ) injection  40 mg Subcutaneous QHS   folic acid   1 mg Oral Daily   furosemide   40 mg Oral Daily   insulin  aspart  0-24 Units Subcutaneous TID AC & HS   metFORMIN   500 mg Oral Q breakfast   metoprolol  tartrate  25 mg Oral BID   Or   metoprolol  tartrate  25 mg Per Tube BID   pantoprazole   40 mg Oral Daily   potassium chloride  SA  20 mEq Oral Daily   sodium chloride  flush  3 mL Intravenous Q12H   thiamine   100 mg Oral Daily   Or   thiamine   100 mg Intravenous Daily   Continuous Infusions:  sodium chloride      PRN Meds:.sodium chloride , alum & mag hydroxide-simeth, magnesium  hydroxide, metoprolol  tartrate, ondansetron  (ZOFRAN ) IV, mouth rinse, oxyCODONE , sodium chloride  flush  Xrays DG Chest Port 1 View Result Date: 09/29/2023 CLINICAL DATA:  63 year old male  status post CABG postoperative day 2. EXAM: PORTABLE CHEST 1 VIEW COMPARISON:  Portable chest yesterday and earlier. FINDINGS: Portable AP upright view at 0535 hours. Swan-Ganz catheter removed, right IJ introducer sheath remains. Mediastinal/chest tubes removed. Stable low lung volumes. Stable cardiac size and mediastinal contours. No pneumothorax. Stable pulmonary vascularity, no pulmonary edema. Ongoing confluent lung base opacity favored to be atelectasis, associated streaky opacity along the right minor fissure. Stable ventilation overall. Stable visualized osseous structures.  Negative visible bowel gas. IMPRESSION: 1. Swan-Ganz catheter and chest tubes removed. Right IJ introducer sheath remains. 2. Stable low lung volumes with atelectasis. No pneumothorax or pulmonary edema.  Electronically Signed   By: Marlise Simpers M.D.   On: 09/29/2023 08:06    Assessment/Plan: S/P Procedure(s) (LRB): CORONARY ARTERY BYPASS GRAFTING (CABG) TIMES THREE  UTILIZING LEFT INTERNAL MAMMARY ARTERY AND ENDOSCOPIC VEIN HARVEST GREATER SAPHENOUS VEIN (N/A) ECHOCARDIOGRAM, TRANSESOPHAGEAL, INTRAOPERATIVE (N/A)  POD#3 1 afeb, S BP 110's-140's, SR, episodes of prolonged supravent tachy- will increase metoprolol  and recheck lytes 2 sats good on RA 3 UOP- not all measured 4 weight approx 2 kg >preop, cont to diurese 5 labs pending- will also get magnesium  6 CXR small left effus, some minor atx 7 CBG's good control-glucophage  has been resumed 8 cont rehab and pulm hygiene, poss home 1-2 days if no significant changes   LOS: 6 days    Lindi Revering PA-C Pager [phone] 09/30/2023 I have seen and examined Mr. Heinzel and agree with findings and plan outlined above  Landon Pinion C. Luna Salinas, MD [facility] [phone]

## 2023-10-01 ENCOUNTER — Other Ambulatory Visit (HOSPITAL_COMMUNITY): Payer: Self-pay

## 2023-10-01 LAB — GLUCOSE, CAPILLARY: Glucose-Capillary: 101 mg/dL — ABNORMAL HIGH (ref 70–99)

## 2023-10-01 MED ORDER — METOPROLOL TARTRATE 50 MG PO TABS
50.0000 mg | ORAL_TABLET | Freq: Two times a day (BID) | ORAL | 5 refills | Status: AC
  Filled 2023-10-01: qty 60, 30d supply, fill #0
  Filled 2023-10-28: qty 60, 30d supply, fill #1
  Filled 2023-11-24: qty 60, 30d supply, fill #2
  Filled 2023-12-19: qty 60, 30d supply, fill #3
  Filled 2024-01-25: qty 60, 30d supply, fill #4
  Filled 2024-02-26: qty 60, 30d supply, fill #5

## 2023-10-01 MED ORDER — ACETAMINOPHEN 325 MG PO TABS
650.0000 mg | ORAL_TABLET | Freq: Four times a day (QID) | ORAL | 0 refills | Status: AC | PRN
  Filled 2023-10-01: qty 100, 13d supply, fill #0

## 2023-10-01 MED ORDER — OXYCODONE HCL 5 MG PO TABS
5.0000 mg | ORAL_TABLET | Freq: Four times a day (QID) | ORAL | 0 refills | Status: DC | PRN
  Filled 2023-10-01: qty 24, 6d supply, fill #0

## 2023-10-01 NOTE — TOC Transition Note (Signed)
 Transition of Care (TOC) - Discharge Note Sherin Dingwall RN, BSN Transitions of Care Unit 4E- RN Case Manager See Treatment Team for direct phone #   Patient Details  Name: Paul Dickerson MRN: [id number] Date of Birth: Apr 19, 1961  [facility]) CM/SW Contact:  Rox Cope, RN Phone Number: 10/01/2023, 11:23 AM   Clinical Narrative:    Pt stable for transition home today, no HH or DME needs noted. Pt has transportation home.   No further TOC intervention needed.    Final next level of care: Home/Self Care Barriers to Discharge: No Barriers Identified   Patient Goals and CMS Choice Patient states their goals for this hospitalization and ongoing recovery are:: return home and get better   Choice offered to / list presented to : NA      Discharge Placement               Home        Discharge Plan and Services Additional resources added to the After Visit Summary for   In-house Referral: NA Discharge Planning Services: NA Post Acute Care Choice: NA          DME Arranged: N/A DME Agency: NA       HH Arranged: NA HH Agency: NA        Social Drivers of Health (SDOH) Interventions SDOH Screenings   Food Insecurity: No Food Insecurity (09/24/2023)  Housing: Low Risk  (09/24/2023)  Transportation Needs: No Transportation Needs (09/24/2023)  Utilities: Not At Risk (09/24/2023)  Depression (PHQ2-9): Low Risk  (11/13/2022)  Tobacco Use: Low Risk  (09/24/2023)     Readmission Risk Interventions    10/01/2023   11:23 AM  Readmission Risk Prevention Plan  Post Dischage Appt Complete  Medication Screening Complete  Transportation Screening Complete

## 2023-10-01 NOTE — Progress Notes (Addendum)
 [address]       [place] [address]             [phone]      4 Days Post-Op Procedure(s) (LRB): CORONARY ARTERY BYPASS GRAFTING (CABG) TIMES THREE  UTILIZING LEFT INTERNAL MAMMARY ARTERY AND ENDOSCOPIC VEIN HARVEST GREATER SAPHENOUS VEIN (N/A) ECHOCARDIOGRAM, TRANSESOPHAGEAL, INTRAOPERATIVE (N/A) Subjective: Up in the bedside chair.  Said he feels well and has no new concerns.  He reports he is independent with mobility and transfers.  Tolerating diet and having appropriate bowel function. He would like to return home today.  Objective: Vital signs in last 24 hours: Temp:  [98.3 F (36.8 C)-99.6 F (37.6 C)] 98.3 F (36.8 C) (05/02 0342) Pulse Rate:  [78-93] 79 (05/02 0342) Cardiac Rhythm: Normal sinus rhythm;Bundle branch block (05/01 2100) Resp:  [20] 20 (05/02 0342) BP: (110-127)/(72-81) 115/81 (05/02 0342) SpO2:  [92 %-100 %] 94 % (05/02 0342) Weight:  [105.7 kg] 105.7 kg (05/02 0342)    Intake/Output from previous day: 05/01 0701 - 05/02 0700 In: 120 [P.O.:120] Out: -  Intake/Output this shift: No intake/output data recorded.  General appearance: alert, cooperative, and no distress Heart: regular rate and rhythm.  Monitor review shows sinus rhythm with no further SVT.  He does have occasional PVCs. Lungs: clear to auscultation bilaterally Abdomen: benign Extremities: No peripheral edema  Wound: Sternotomy incision and right lower extremity EVH incisions are dry and intact.  Lab Results: Recent Labs    09/29/23 0524 09/30/23 0938  WBC 7.3 7.2  HGB 9.3* 9.8*  HCT 30.0* 31.5*  PLT 125* 184   BMET:  Recent Labs    09/29/23 0524 09/30/23 0938  NA 134* 131*  K 3.6 4.1  CL 104 99  CO2 21* 21*  GLUCOSE 106* 193*  BUN 9 11  CREATININE 0.87 0.99  CALCIUM  8.0* 8.9    PT/INR:  No results for input(s): "LABPROT", "INR" in the last 72 hours.  ABG    Component Value Date/Time   PHART 7.361 09/27/2023 1908   HCO3 22.6  09/27/2023 1908   TCO2 24 09/27/2023 1908   ACIDBASEDEF 3.0 (H) 09/27/2023 1908   O2SAT 99 09/27/2023 1908   CBG (last 3)  Recent Labs    09/30/23 1623 09/30/23 2111 10/01/23 0555  GLUCAP 174* 150* 101*    Meds Scheduled Meds:  acetaminophen   1,000 mg Oral Q6H   Or   acetaminophen  (TYLENOL ) oral liquid 160 mg/5 mL  1,000 mg Per Tube Q6H   amLODipine   10 mg Oral Daily   aspirin  EC  325 mg Oral Daily   Or   aspirin   324 mg Per Tube Daily   atorvastatin   80 mg Oral Daily   bisacodyl   10 mg Oral Daily   Or   bisacodyl   10 mg Rectal Daily   Chlorhexidine  Gluconate Cloth  6 each Topical Daily   [facility], Patient & Family Education   Does not apply Once   docusate sodium   200 mg Oral Daily   enoxaparin  (LOVENOX ) injection  40 mg Subcutaneous QHS   folic acid   1 mg Oral Daily   furosemide   40 mg Oral Daily   insulin  aspart  0-24 Units Subcutaneous TID AC & HS   metFORMIN   500 mg Oral Q breakfast   metoprolol  tartrate  50 mg Oral BID   Or   metoprolol  tartrate  50 mg Per Tube BID   pantoprazole   40  mg Oral Daily   potassium chloride  SA  20 mEq Oral Daily   sodium chloride  flush  3 mL Intravenous Q12H   thiamine   100 mg Oral Daily   Or   thiamine   100 mg Intravenous Daily   Continuous Infusions:   PRN Meds:.alum & mag hydroxide-simeth, magnesium  hydroxide, metoprolol  tartrate, ondansetron  (ZOFRAN ) IV, mouth rinse, oxyCODONE , sodium chloride  flush  Xrays DG Chest 2 View Result Date: 09/30/2023 CLINICAL DATA:  Status post CABG. EXAM: CHEST - 2 VIEW COMPARISON:  AP chest 09/29/2023 and 09/28/2023 FINDINGS: Interval removal of prior right internal jugular central venous catheter sheath. Status post median sternotomy and CABG. Small left pleural effusion is similar to prior. Streaky likely subsegmental atelectasis overlying the right mid to lower lung is similar to prior. No pneumothorax. Moderate multilevel degenerative disc changes of the thoracic spine.  IMPRESSION: 1. Status post median sternotomy and CABG. 2. Small left pleural effusion is similar to prior. 3. Streaky likely subsegmental atelectasis overlying the right mid to lower lung, similar to prior. Electronically Signed   By: Bertina Broccoli M.D.   On: 09/30/2023 08:42    Assessment/Plan: S/P Procedure(s) (LRB): CORONARY ARTERY BYPASS GRAFTING (CABG) TIMES THREE  UTILIZING LEFT INTERNAL MAMMARY ARTERY AND ENDOSCOPIC VEIN HARVEST GREATER SAPHENOUS VEIN (N/A) ECHOCARDIOGRAM, TRANSESOPHAGEAL, INTRAOPERATIVE (N/A)  -Postop day 4 CABG x 3, preop EF 35 to 45%.  On aspirin , metoprolol , amlodipine , and atorvastatin .  BP and cardiac rhythm are stable.  -History of hypertension-BP control reasonable on current regimen.  -Endo: Type 2 diabetes mellitus.  CBG control reasonable on low-dose metformin  and sliding scale insulin .  - Heme: Mild expected acute blood loss anemia with uptrending hematocrit.  -Renal: Normal function, weight is about 1 kg above preop.  Will continue Lasix  for a few days after discharge.  -Disposition: Plan for discharge home today with his daughter assisting him.  Instructions given and follow-up has been arranged.   LOS: 7 days    Leata Providence PA-C Pager [phone] 10/01/2023 Patient seen and examined, agree with above Home today  Milon Aloe. Luna Salinas, MD [facility] [phone]

## 2023-10-01 NOTE — Discharge Instructions (Signed)

## 2023-10-01 NOTE — Progress Notes (Signed)
 CARDIAC REHAB PHASE I   Did education with patient. Discussed heart healthy diet, sternal precautions, IS use, wound care, and exercise guidelines. Will refer to [facility] [place]. Pt left in the recliner w/ RN giving meds. Discussed family care upon discharge w/ pt. Pt has friend to assist with care.   [phone] Paul Huron, MS, ACSM-CEP 10/01/2023 9:18 AM

## 2023-10-07 ENCOUNTER — Other Ambulatory Visit: Payer: Self-pay | Admitting: Physician Assistant

## 2023-10-07 ENCOUNTER — Other Ambulatory Visit (HOSPITAL_COMMUNITY): Payer: Self-pay

## 2023-10-08 ENCOUNTER — Other Ambulatory Visit (HOSPITAL_COMMUNITY): Payer: Self-pay

## 2023-10-13 ENCOUNTER — Telehealth (HOSPITAL_COMMUNITY): Payer: Self-pay

## 2023-10-13 NOTE — Telephone Encounter (Signed)
 Attempted to call patient in regards to Cardiac Rehab - Unable to leave VM, VM box is full.

## 2023-10-13 NOTE — Progress Notes (Signed)
 Cardiology Office Note    Patient Name: Paul Dickerson Date of Encounter: 10/14/2023  Primary Care Provider:  Ernestina Headland, MD Primary Cardiologist:  Jann Melody, MD Primary Electrophysiologist: None   Past Medical History    Past Medical History:  Diagnosis Date   Hypertension    Type 2 diabetes mellitus (HCC)     History of Present Illness  KEYMARI ZUEGE is a 63 y.o. 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  Mr. Rockefeller was seen initially by Dr. Annabelle Barrack on 01/19/2023 for evaluation of heart failure.  He has a history of prior MI in 2007 treated with stents and 2D echo completed 01/2022 showing EF of 45-50% with RWMA and mild LVH.  He completed a cardiac CT to evaluate ischemic disease that shows significant blockage in the main coronary artery with some soft plaque present.  He was recommended to undergo left heart cath for further evaluation of coronary anatomy.  He underwent left heart cath with Dr. Abel Hoe on 09/24/2023 and revealed severe multivessel CAD with severe distal left main stenosis and proximal RCA subtotal occlusion with anterior wall hypokinesis.  He was evaluated by CVTS and underwent CABG x 3 with Dr. Luna Salinas with internal mammary artery and saphenous vein graft.  He tolerated surgery well and was started on low-dose beta-blocker postoperatively.  He was discharged on 10/01/2023.  He is scheduled for follow-up on 11/02/2023 with Dr. Luna Salinas.  Mr. Segrest presents today with his significant other for post bypass follow-up. Since the surgery, he is doing well overall. He experiences manageable pain and notes improvement, although he still has swelling in the leg from which blood vessels were harvested. No new chest pain or shortness of breath. He is scheduled for a follow-up on June 3rd. He is currently on HCTZ and  Lopressor  50 mg. No dizziness when moving from sitting to standing. He has not had lab work since being discharged from the hospital, but previous labs showed stable kidney function, high blood sugar, and low sodium levels. He is now drinking more water to address the low sodium. He reports a change in dietary preferences, now favoring seafood and salads over chicken, which he used to enjoy. He is mindful of his diet and is working on lifestyle changes post-surgery. He is also on a statin and baby aspirin  to maintain graft patency. He has a history of sleep apnea but does not use a CPAP machine, citing discomfort. He functions on limited sleep due to his work schedule, which often requires early morning hours. No daytime fatigue. 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 He is not yet cleared to drive. He mentions lifting heavy weights at home, which he is advised against until further evaluation. He used to drink alcohol for the taste but now prefers non-alcoholic beer. He is also experiencing some leg swelling. Patient denies chest pain, palpitations, dyspnea, PND, orthopnea, nausea, vomiting, dizziness, syncope, edema, weight gain, or early satiety.  Discussed the use of AI scribe software for clinical note transcription with the patient, who gave verbal consent to proceed.  History of Present Illness   Review of Systems  Please see the history of present illness.    All other systems reviewed and are otherwise negative except as noted  above.  Physical Exam     Wt Readings from Last 3 Encounters:  10/14/23 232 lb 9.6 oz (105.5 kg)  10/01/23 233 lb 0.4 oz (105.7 kg)  09/20/23 232 lb (105.2 kg)   VS: Vitals:   10/14/23 1053  BP: 128/74  Pulse: 66  SpO2: 99%  ,Body mass index is 32.44 kg/m. GEN: Well nourished, well developed in no acute distress Neck: No JVD; No carotid  bruits Pulmonary: Clear to auscultation without rales, wheezing or rhonchi  Cardiovascular: Normal rate. Regular rhythm. Normal S1. Normal S2.  Median sternotomy incision clean dry and intact as well as right lower leg incision for saphenous graft Murmurs: There is no murmur.  ABDOMEN: Soft, non-tender, non-distended EXTREMITIES: +2 right lower extremity edema and trace left lower extremity edema  EKG/LABS/ Recent Cardiac Studies   ECG personally reviewed by me today -none completed today  Risk Assessment/Calculations:          Lab Results  Component Value Date   WBC 7.2 09/30/2023   HGB 9.8 (L) 09/30/2023   HCT 31.5 (L) 09/30/2023   MCV 75.7 (L) 09/30/2023   PLT 184 09/30/2023   Lab Results  Component Value Date   CREATININE 0.99 09/30/2023   BUN 11 09/30/2023   NA 131 (L) 09/30/2023   K 4.1 09/30/2023   CL 99 09/30/2023   CO2 21 (L) 09/30/2023   Lab Results  Component Value Date   CHOL 177 01/19/2023   HDL 64 01/19/2023   LDLCALC 101 (H) 01/19/2023   TRIG 61 01/19/2023   CHOLHDL 2.8 01/19/2023    Lab Results  Component Value Date   HGBA1C 7.0 (H) 09/26/2023   Assessment & Plan    Assessment and Plan Assessment & Plan Coronary artery disease with history of bypass surgery Status post three-vessel coronary artery bypass grafting. No new chest pain or dyspnea. Blood pressure well-controlled. Cardiac rehabilitation recommended for cardiovascular health improvement. - Recommend cardiac rehabilitation program. - Order chest X-ray prior to follow-up with Dr. Luna Salinas on June 3rd. - Continue current medications including statin and aspirin . - Educate on lifestyle modifications including diet and exercise.  Heart failure Heart failure with reduced ejection fraction noted. Post-bypass surgery, heart function expected to improve. No current symptoms of exacerbation. - Order echocardiogram in three months to assess heart function. - Monitor for symptoms of heart  failure exacerbation.  Hypertension Blood pressure well-controlled on current medication regimen. - Continue current antihypertensive medications. - Monitor blood pressure regularly.  Diabetes mellitus Blood sugar levels slightly elevated. Hemoglobin A1c at goal but requires further improvement. - Encourage dietary modifications to reduce sugar intake. - Increase physical activity as tolerated. - Monitor blood glucose levels regularly.  Anemia post-surgery Post-surgical anemia with hemoglobin levels previously at 9.8. No symptoms of fatigue reported. - Order complete blood count to monitor hemoglobin levels. - Monitor for symptoms of anemia such as fatigue.  Chronic leg swelling post-surgery Chronic leg swelling likely due to vein harvesting for bypass surgery. No signs of infection at the incision site. - Prescribe Lasix  as needed for swelling. - Advise on leg elevation and use of compression stockings. - Monitor for signs of infection or worsening swelling.  Sleep apnea History of sleep apnea with current non-use of CPAP due to discomfort. Discussed potential benefits of CPAP. - Discuss alternative treatments for sleep apnea if symptoms worsen. - Monitor for symptoms of sleep apnea such as daytime fatigue.    1.  CAD: -s/p remote MI in 2007 with CABG  x 3 completed on 08/2023 -No new chest pain or dyspnea. Blood pressure well-controlled. Cardiac rehabilitation recommended for cardiovascular health improvement. - Recommend cardiac rehabilitation program. - Order chest X-ray prior to follow-up with Dr. Luna Salinas on June 3rd. - Continue current medications including statin and aspirin . - Educate on lifestyle modifications including diet and exercise.  2.  Essential hypertension: -Blood pressure well-controlled at 128/74 on current medication regimen. - Continue continue HCTZ 25 mg, metoprolol  50 mg twice daily and Norvasc  5 mg daily - Monitor blood pressure regularly.  3.   Hyperlipidemia: - Patient's LDL cholesterol was 101 and above goal on 01/19/2023 - Continue Lipitor  80 mg daily - Will recheck LFT and lipids in 6 weeks  4.  DM type II: Blood sugar levels slightly elevated. Hemoglobin A1c at goal but requires further improvement. - Encourage dietary modifications to reduce sugar intake. - Increase physical activity as tolerated. - Monitor blood glucose levels regularly.  5.  HFrEF/ICM: Heart failure with reduced ejection fraction noted. Post-bypass surgery, heart function expected to improve. No current symptoms of exacerbation. - Order echocardiogram in three months to assess heart function. - Monitor for symptoms of heart failure exacerbation. - Continue metoprolol  50 mg twice daily and as needed Lasix  20 mg daily    Cardiac Rehabilitation Eligibility Assessment  The patient is ready to start cardiac rehabilitation pending clearance from the cardiac surgeon.     Disposition: Follow-up with Jann Melody, MD or APP in 3 months    Signed, Francene Ing, Retha Cast, NP 10/14/2023, 12:10 PM [facility]

## 2023-10-14 ENCOUNTER — Encounter: Payer: Self-pay | Admitting: Nurse Practitioner

## 2023-10-14 ENCOUNTER — Ambulatory Visit: Attending: Nurse Practitioner | Admitting: Nurse Practitioner

## 2023-10-14 ENCOUNTER — Other Ambulatory Visit (HOSPITAL_COMMUNITY): Payer: Self-pay

## 2023-10-14 VITALS — BP 128/74 | HR 66 | Ht 71.0 in | Wt 232.6 lb

## 2023-10-14 DIAGNOSIS — I10 Essential (primary) hypertension: Secondary | ICD-10-CM | POA: Diagnosis present

## 2023-10-14 DIAGNOSIS — E119 Type 2 diabetes mellitus without complications: Secondary | ICD-10-CM | POA: Insufficient documentation

## 2023-10-14 DIAGNOSIS — I2510 Atherosclerotic heart disease of native coronary artery without angina pectoris: Secondary | ICD-10-CM | POA: Diagnosis present

## 2023-10-14 DIAGNOSIS — I38 Endocarditis, valve unspecified: Secondary | ICD-10-CM | POA: Insufficient documentation

## 2023-10-14 DIAGNOSIS — E785 Hyperlipidemia, unspecified: Secondary | ICD-10-CM | POA: Diagnosis present

## 2023-10-14 DIAGNOSIS — I5042 Chronic combined systolic (congestive) and diastolic (congestive) heart failure: Secondary | ICD-10-CM | POA: Insufficient documentation

## 2023-10-14 MED ORDER — FUROSEMIDE 20 MG PO TABS
20.0000 mg | ORAL_TABLET | Freq: Every day | ORAL | 1 refills | Status: DC | PRN
  Filled 2023-10-14: qty 30, 30d supply, fill #0

## 2023-10-14 MED ORDER — OMRON 3 SERIES BP MONITOR DEVI
1.0000 | Freq: Every day | 0 refills | Status: AC
  Filled 2023-10-14: qty 1, 30d supply, fill #0

## 2023-10-14 NOTE — Patient Instructions (Signed)
 Medication Instructions:  START Lasix  20mg  Take 1 tablet daily as needed   *If you need a refill on your cardiac medications before your next appointment, please call your pharmacy*  Lab Work: TODAY-BMET & CBC 5 WEEKS LIPIDS & LFTS (November 18, 2023) If you have labs (blood work) drawn today and your tests are completely normal, you will receive your results only by: MyChart Message (if you have MyChart) OR A paper copy in the mail If you have any lab test that is abnormal or we need to change your treatment, we will call you to review the results.  Testing/Procedures: Your physician has requested that you have an echocardiogram. Echocardiography is a painless test that uses sound waves to create images of your heart. It provides your doctor with information about the size and shape of your heart and how well your heart's chambers and valves are working. This procedure takes approximately one hour. There are no restrictions for this procedure. Please do NOT wear cologne, perfume, aftershave, or lotions (deodorant is allowed). Please arrive 15 minutes prior to your appointment time.  Please note: We ask at that you not bring children with you during ultrasound (echo/ vascular) testing. Due to room size and safety concerns, children are not allowed in the ultrasound rooms during exams. Our front office staff cannot provide observation of children in our lobby area while testing is being conducted. An adult accompanying a patient to their appointment will only be allowed in the ultrasound room at the discretion of the ultrasound technician under special circumstances. We apologize for any inconvenience.  Follow-Up: At [facility], you and your health needs are our priority.  As part of our continuing mission to provide you with exceptional heart care, our providers are all part of one team.  This team includes your primary Cardiologist (physician) and Advanced Practice Providers or APPs  (Physician Assistants and Nurse Practitioners) who all work together to provide you with the care you need, when you need it.  Your next appointment:   3 month(s)  Provider:   Jann Melody, MD    We recommend signing up for the patient portal called "MyChart".  Sign up information is provided on this After Visit Summary.  MyChart is used to connect with patients for Virtual Visits (Telemedicine).  Patients are able to view lab/test results, encounter notes, upcoming appointments, etc.  Non-urgent messages can be sent to your provider as well.   To learn more about what you can do with MyChart, go to [email or web address].   Other Instructions Please check your weight daily. Please contact the office if you gain more than 2lbs in a day or 5lbs in a week.  Limit your salt intake to 1500-2000mg  per day or 500mg  of Sodium per meal.  Check your blood pressure daily for 2 weeks, then contact the office with your readings.  Contact the office either by phone or MyChart with your readings.  Make sure to check your blood pressure 2 hours after taking your medications.   AVOID these things for 30 minutes before checking your blood pressure: No Drinking caffeine. No Drinking alcohol. No Eating. No Smoking. No Exercising.  Five minutes before checking your blood pressure: Pee. Sit in a dining chair. Avoid sitting in a soft couch or armchair. Be quiet. Do not talk.

## 2023-10-15 ENCOUNTER — Ambulatory Visit: Payer: Self-pay | Admitting: Nurse Practitioner

## 2023-10-15 ENCOUNTER — Other Ambulatory Visit (HOSPITAL_COMMUNITY): Payer: Self-pay

## 2023-10-15 DIAGNOSIS — I38 Endocarditis, valve unspecified: Secondary | ICD-10-CM

## 2023-10-15 LAB — BASIC METABOLIC PANEL WITH GFR
BUN/Creatinine Ratio: 12 (ref 10–24)
BUN: 12 mg/dL (ref 8–27)
CO2: 22 mmol/L (ref 20–29)
Calcium: 9.9 mg/dL (ref 8.6–10.2)
Chloride: 101 mmol/L (ref 96–106)
Creatinine, Ser: 1.03 mg/dL (ref 0.76–1.27)
Glucose: 142 mg/dL — ABNORMAL HIGH (ref 70–99)
Potassium: 4.5 mmol/L (ref 3.5–5.2)
Sodium: 138 mmol/L (ref 134–144)
eGFR: 82 mL/min/{1.73_m2} (ref >59)

## 2023-10-15 LAB — CBC
Hematocrit: 32.6 % — ABNORMAL LOW (ref 37.5–51.0)
Hemoglobin: 9.8 g/dL — ABNORMAL LOW (ref 13.0–17.7)
MCH: 23.5 pg — ABNORMAL LOW (ref 26.6–33.0)
MCHC: 30.1 g/dL — ABNORMAL LOW (ref 31.5–35.7)
MCV: 78 fL — ABNORMAL LOW (ref 79–97)
Platelets: 377 10*3/uL (ref 150–450)
RBC: 4.17 x10E6/uL (ref 4.14–5.80)
RDW: 14.2 % (ref 11.6–15.4)
WBC: 4.9 10*3/uL (ref 3.4–10.8)

## 2023-10-19 ENCOUNTER — Other Ambulatory Visit: Payer: Self-pay

## 2023-10-19 DIAGNOSIS — I10 Essential (primary) hypertension: Secondary | ICD-10-CM

## 2023-10-19 DIAGNOSIS — E785 Hyperlipidemia, unspecified: Secondary | ICD-10-CM

## 2023-10-19 DIAGNOSIS — E119 Type 2 diabetes mellitus without complications: Secondary | ICD-10-CM

## 2023-10-19 DIAGNOSIS — I2510 Atherosclerotic heart disease of native coronary artery without angina pectoris: Secondary | ICD-10-CM

## 2023-10-19 DIAGNOSIS — I38 Endocarditis, valve unspecified: Secondary | ICD-10-CM

## 2023-10-21 ENCOUNTER — Other Ambulatory Visit (HOSPITAL_COMMUNITY): Payer: Self-pay

## 2023-10-21 ENCOUNTER — Other Ambulatory Visit: Payer: Self-pay | Admitting: Physician Assistant

## 2023-10-21 ENCOUNTER — Ambulatory Visit: Payer: Self-pay

## 2023-10-27 ENCOUNTER — Other Ambulatory Visit: Payer: Self-pay | Admitting: Thoracic Surgery (Cardiothoracic Vascular Surgery)

## 2023-10-27 DIAGNOSIS — Z951 Presence of aortocoronary bypass graft: Secondary | ICD-10-CM

## 2023-10-28 ENCOUNTER — Other Ambulatory Visit (HOSPITAL_COMMUNITY): Payer: Self-pay

## 2023-11-02 ENCOUNTER — Ambulatory Visit
Payer: Self-pay | Attending: Thoracic Surgery (Cardiothoracic Vascular Surgery) | Admitting: Thoracic Surgery (Cardiothoracic Vascular Surgery)

## 2023-11-02 ENCOUNTER — Ambulatory Visit (HOSPITAL_COMMUNITY)
Admission: RE | Admit: 2023-11-02 | Discharge: 2023-11-02 | Disposition: A | Discharge status: Home / Self Care | Source: Ambulatory Visit | Attending: Cardiovascular Disease | Admitting: Cardiovascular Disease

## 2023-11-02 ENCOUNTER — Encounter: Payer: Self-pay | Admitting: Thoracic Surgery (Cardiothoracic Vascular Surgery)

## 2023-11-02 VITALS — BP 138/82 | HR 78 | Resp 18 | Ht 71.0 in | Wt 224.0 lb

## 2023-11-02 DIAGNOSIS — Z951 Presence of aortocoronary bypass graft: Secondary | ICD-10-CM | POA: Insufficient documentation

## 2023-11-02 NOTE — Progress Notes (Signed)
 [address]       [name] [address]             [phone]     HPI: Paul Dickerson returns for a scheduled follow-up visit  Paul Dickerson is a 63 year old man with a history of hypertension, type 2 diabetes, and prior coronary disease.  He had a cardiac CT which showed left main and three-vessel coronary disease.  Ejection fraction was 35 to 40%.  Catheterization confirmed left main and three-vessel disease.  He underwent coronary bypass grafting x 4 on 09/27/2023.  He had small diffusely diseased vessels.  He did well postoperatively and went home on day 4.  Feels well.  Not having any surgical pain.  No recurrent anginal pain.  Energy levels are improving.  Has been driving without any issues.  Past Medical History:  Diagnosis Date   Hypertension    Type 2 diabetes mellitus (HCC)     Current Outpatient Medications  Medication Sig Dispense Refill   acetaminophen  (TYLENOL ) 325 MG tablet Take 2 tablets (650 mg total) by mouth every 6 (six) hours as needed. 100 tablet 0   amLODipine  (NORVASC ) 5 MG tablet TAKE 1 TAB BY MOUTH AT BEDTIME 90 tablet 3   aspirin  EC 81 MG tablet Take 1 tablet (81 mg total) by mouth daily. Swallow whole. 150 tablet 2   atorvastatin  (LIPITOR ) 80 MG tablet Take 1 tablet (80 mg total) by mouth daily. 90 tablet 3   Blood Pressure Monitoring (OMRON 3 SERIES BP MONITOR) DEVI Use as directed. 1 each 0   folic acid  (FOLVITE ) 1 MG tablet Take 1 tablet (1 mg total) by mouth daily. 30 tablet 1   furosemide  (LASIX ) 20 MG tablet Take 1 tablet (20 mg total) by mouth daily as needed for fluid (2LBS IN A DAY/5LBS IN A WEEK). 30 tablet 1   hydrochlorothiazide  (HYDRODIURIL ) 25 MG tablet TAKE 1 TABLET BY MOUTH EVERY DAY 90 tablet 2   metFORMIN  (GLUCOPHAGE -XR) 500 MG 24 hr tablet TAKE 1 TABLET BY MOUTH EVERY DAY WITH BREAKFAST 90 tablet 1   metoprolol  tartrate (LOPRESSOR ) 50 MG tablet Take 1 tablet (50 mg total) by mouth 2 (two) times daily. 60 tablet 5    thiamine  (VITAMIN B1) 100 MG tablet Take 1 tablet (100 mg total) by mouth daily. 30 tablet 1   No current facility-administered medications for this visit.    Physical Exam BP 138/82 (BP Location: Left Arm)   Pulse 78   Resp 18   Ht 5\' 11"  (1.803 m)   Wt 224 lb (101.6 kg)   SpO2 98% Comment: RA  BMI 31.27 kg/m  63 year old man in no acute distress Alert and oriented x 3 with no focal deficits Lungs clear Cardiac regular rate and rhythm with normal S1 and S2 Sternum stable, incision well-healed Minimal eschar right chest tube sites No peripheral edema  Diagnostic Tests: I personally viewed his chest x-ray images.  Old rib fracture on the left.  Postoperative changes.  No effusions or infiltrates.  Impression: Paul Dickerson is a 62 year old man with past medical history significant for hypertension, type 2 diabetes, and coronary disease.  Found to have significant disease on a cardiac CT which was confirmed by catheterization.  Underwent coronary bypass grafting x 4 on 09/27/2023.  Status post CABG x 4-doing well.  No pain.  No recurrent angina.  Now about 5 weeks out from surgery.  Recommended he wait another week before lifting thing over  10 pounds.  He has been driving without any issues.  He works part-time delivering prescriptions.  Does not involve heavy lifting.  I recommended he wait least another week and then he can return when he feels up to it.  Plan: Follow-up with cardiology I will be happy to see him back anytime if I can be of any further assistance with his care  Zelphia Higashi, MD [facility] [phone]

## 2023-11-04 ENCOUNTER — Other Ambulatory Visit: Payer: Self-pay | Admitting: Student

## 2023-11-04 ENCOUNTER — Other Ambulatory Visit (HOSPITAL_COMMUNITY): Payer: Self-pay

## 2023-11-04 DIAGNOSIS — F419 Anxiety disorder, unspecified: Secondary | ICD-10-CM

## 2023-11-04 DIAGNOSIS — I10 Essential (primary) hypertension: Secondary | ICD-10-CM

## 2023-11-05 ENCOUNTER — Other Ambulatory Visit (HOSPITAL_COMMUNITY): Payer: Self-pay

## 2023-11-06 MED ORDER — SERTRALINE HCL 50 MG PO TABS
50.0000 mg | ORAL_TABLET | Freq: Every day | ORAL | 3 refills | Status: AC
Start: 2023-11-06 — End: ?
  Filled 2023-11-06: qty 30, 30d supply, fill #0

## 2023-11-08 ENCOUNTER — Other Ambulatory Visit (HOSPITAL_COMMUNITY): Payer: Self-pay

## 2023-11-09 ENCOUNTER — Telehealth (HOSPITAL_COMMUNITY): Payer: Self-pay

## 2023-11-09 ENCOUNTER — Encounter: Payer: Self-pay | Admitting: *Deleted

## 2023-11-09 NOTE — Telephone Encounter (Signed)
Pt is unable to do the cardiac rehab program. Closed referral.

## 2023-11-18 ENCOUNTER — Other Ambulatory Visit (HOSPITAL_COMMUNITY): Payer: Self-pay

## 2023-11-24 ENCOUNTER — Other Ambulatory Visit (HOSPITAL_COMMUNITY): Payer: Self-pay

## 2023-11-24 ENCOUNTER — Ambulatory Visit (HOSPITAL_COMMUNITY)
Admission: RE | Admit: 2023-11-24 | Discharge: 2023-11-24 | Disposition: A | Discharge status: Home / Self Care | Source: Ambulatory Visit | Attending: Cardiology | Admitting: Cardiology

## 2023-11-24 DIAGNOSIS — E785 Hyperlipidemia, unspecified: Secondary | ICD-10-CM | POA: Diagnosis not present

## 2023-11-24 DIAGNOSIS — I2510 Atherosclerotic heart disease of native coronary artery without angina pectoris: Secondary | ICD-10-CM | POA: Diagnosis not present

## 2023-11-24 DIAGNOSIS — I5042 Chronic combined systolic (congestive) and diastolic (congestive) heart failure: Secondary | ICD-10-CM

## 2023-11-24 DIAGNOSIS — E119 Type 2 diabetes mellitus without complications: Secondary | ICD-10-CM | POA: Diagnosis present

## 2023-11-24 DIAGNOSIS — I38 Endocarditis, valve unspecified: Secondary | ICD-10-CM | POA: Diagnosis present

## 2023-11-24 DIAGNOSIS — I10 Essential (primary) hypertension: Secondary | ICD-10-CM

## 2023-11-24 LAB — ECHOCARDIOGRAM COMPLETE
Area-P 1/2: 3.45 cm2
S' Lateral: 3.95 cm

## 2023-11-25 ENCOUNTER — Other Ambulatory Visit (HOSPITAL_COMMUNITY): Payer: Self-pay

## 2023-11-25 MED ORDER — FUROSEMIDE 20 MG PO TABS
20.0000 mg | ORAL_TABLET | Freq: Every day | ORAL | 1 refills | Status: AC
  Filled 2023-11-25: qty 90, 90d supply, fill #0
  Filled 2023-12-19 – 2024-02-20 (×2): qty 90, 90d supply, fill #1

## 2023-11-25 MED ORDER — AMLODIPINE BESYLATE 10 MG PO TABS
10.0000 mg | ORAL_TABLET | Freq: Every day | ORAL | 1 refills | Status: AC
  Filled 2023-11-25: qty 90, 90d supply, fill #0
  Filled 2024-02-13: qty 90, 90d supply, fill #1

## 2023-12-20 ENCOUNTER — Other Ambulatory Visit: Payer: Self-pay

## 2023-12-20 ENCOUNTER — Other Ambulatory Visit (HOSPITAL_COMMUNITY): Payer: Self-pay

## 2024-02-05 ENCOUNTER — Other Ambulatory Visit: Payer: Self-pay | Admitting: Internal Medicine

## 2024-02-07 ENCOUNTER — Other Ambulatory Visit (HOSPITAL_COMMUNITY): Payer: Self-pay

## 2024-02-07 MED ORDER — ATORVASTATIN CALCIUM 80 MG PO TABS
80.0000 mg | ORAL_TABLET | Freq: Every day | ORAL | 2 refills | Status: AC
  Filled 2024-02-07: qty 90, 90d supply, fill #0
  Filled 2024-05-08: qty 90, 90d supply, fill #1

## 2024-02-08 ENCOUNTER — Encounter: Payer: Self-pay | Admitting: Internal Medicine

## 2024-02-08 ENCOUNTER — Ambulatory Visit: Attending: Internal Medicine | Admitting: Internal Medicine

## 2024-02-08 VITALS — BP 138/72 | HR 79 | Resp 16 | Ht 71.0 in | Wt 237.0 lb

## 2024-02-08 DIAGNOSIS — I10 Essential (primary) hypertension: Secondary | ICD-10-CM

## 2024-02-08 DIAGNOSIS — M7989 Other specified soft tissue disorders: Secondary | ICD-10-CM

## 2024-02-08 DIAGNOSIS — I2510 Atherosclerotic heart disease of native coronary artery without angina pectoris: Secondary | ICD-10-CM

## 2024-02-08 DIAGNOSIS — I38 Endocarditis, valve unspecified: Secondary | ICD-10-CM

## 2024-02-08 DIAGNOSIS — I5042 Chronic combined systolic (congestive) and diastolic (congestive) heart failure: Secondary | ICD-10-CM

## 2024-02-08 NOTE — Patient Instructions (Signed)
 Medication Instructions:  Your physician recommends that you continue on your current medications as directed. Please refer to the Current Medication list given to you today.  *If you need a refill on your cardiac medications before your next appointment, please call your pharmacy*  Lab Work: At any [facility]: Fasting Lipid panel, BMP, BNP  If you have labs (blood work) drawn today and your tests are completely normal, you will receive your results only by: MyChart Message (if you have MyChart) OR A paper copy in the mail If you have any lab test that is abnormal or we need to change your treatment, we will call you to review the results.  Testing/Procedures: NONE  Follow-Up: At [facility], you and your health needs are our priority.  As part of our continuing mission to provide you with exceptional heart care, our providers are all part of one team.  This team includes your primary Cardiologist (physician) and Advanced Practice Providers or APPs (Physician Assistants and Nurse Practitioners) who all work together to provide you with the care you need, when you need it.  Your next appointment:   6 month(s)  Provider:   Jackee Alberts, NP

## 2024-02-08 NOTE — Progress Notes (Signed)
 Cardiology Office Note:  .    Date:  02/08/2024  ID:  Paul Dickerson, DOB 1960-09-12, MRN [id number] PCP: Cleotilde Lukes, DO  [facility] Providers Cardiologist:  Stanly DELENA Leavens, MD     CC: CABG f/u   History of Present Illness: .    Paul Dickerson is a 63 year old male with hypertension and diabetes who presents for discussion of heart catheterization due to significant distal left main osteo LAD disease found on recent cardiac CT.  Paul Dickerson is a 63 year old male with hypertension and coronary artery disease who presents for follow-up after multivessel coronary artery bypass grafting.  He underwent multivessel coronary artery bypass grafting on November 02, 2023, following the diagnosis of multivessel coronary artery disease confirmed by a CT scan and heart catheterization. Since the procedure, he has experienced no chest pain or dizziness and has returned to work since July 2025.  He experiences swelling in his legs, particularly the right leg. He is currently taking hydrochlorothiazide  and Lasix  for fluid management, and the medication does not interfere with his work.  He has a history of hypertension and is on medication for blood pressure control. He notes that his blood pressure is slightly above the target range.  He has reduced his alcohol consumption to about two beers per day, which is an improvement from his previous intake. He has also increased his water intake.  He is taking aspirin , Lipitor , and a vitamin supplement as part of his current medication regimen.   Relevant histories: .  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   Studies Reviewed: .   Cardiac Studies & Procedures   ______________________________________________________________________________________________ CARDIAC CATHETERIZATION  CARDIAC CATHETERIZATION 09/24/2023  Conclusion   Prox RCA  lesion is 95% stenosed.   Mid RCA lesion is 99% stenosed.   Mid LM to Dist LM lesion is 70% stenosed.   Dist LM to Prox LAD lesion is 95% stenosed.   Ost Cx to Prox Cx lesion is 80% stenosed.   Mid Cx lesion is 40% stenosed.   2nd Mrg lesion is 50% stenosed.   Mid LAD lesion is 50% stenosed.   Dist LAD lesion is 50% stenosed.   There is mild to moderate left ventricular systolic dysfunction.   LV end diastolic pressure is mildly elevated.   The left ventricular ejection fraction is 35-45% by visual estimate.  Severe triple vessel CAD Severe distal left main stenosis Severe ostial LAD stenosis Severe ostial Circumflex stenosis Severe proximal RCA stenosis. Sub-total occlusion of the mid RCA with TIMI-2 flow beyond this stenosis. Hypokinesis of anterior wall  Recommendations: Will admit to telemetry. He will need a CT surgery consult to discuss CABG. He is not having chest pain or dyspnea so will not place a balloon pump. Continue ASA and statin. Echo today. Consult CT surgery.  Findings Coronary Findings Diagnostic  Dominance: Right  Left Main Mid LM to Dist LM lesion is 70% stenosed. Dist LM to Prox LAD lesion is 95% stenosed.  Left Anterior Descending Vessel is large. Mid LAD lesion is 50% stenosed. Dist LAD lesion is 50% stenosed.  Left Circumflex Ost Cx to Prox Cx lesion is 80% stenosed. Mid Cx lesion is 40% stenosed.  Second Obtuse Marginal Branch 2nd Mrg lesion is 50% stenosed.  Right Coronary Artery Vessel is large. Prox RCA lesion is 95% stenosed. Mid RCA lesion is  99% stenosed.  Intervention  No interventions have been documented.     ECHOCARDIOGRAM  ECHOCARDIOGRAM COMPLETE 11/24/2023  Narrative ECHOCARDIOGRAM REPORT    Patient Name:   Paul Dickerson Date of Exam: 11/24/2023 Medical Rec #:  [id number]         Height:       71.0 in Accession #:    [id number]        Weight:       224.0 lb Date of Birth:  [birth year]         BSA:          2.213  m Patient Age:    63 years          BP:           138/82 mmHg Patient Gender: M                 HR:           60 bpm. Exam Location:  [address]  Procedure: 2D Echo, 3D Echo, Cardiac Doppler and Color Doppler (Both Spectral and Color Flow Doppler were utilized during procedure).  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 CHF, CAD, Prior CABG and Prior Cardiac Surgery; Risk Factors:Hypertension, Diabetes and Dyslipidemia. Recent CABG- EF prior to CABG was 35-40% with improvement to 60-65%.  Sonographer:    Heather Hawks RDCS Referring Phys: JACKEE VEAR DICK  IMPRESSIONS   1. Left ventricular ejection fraction, by estimation, is 55 to 60%. Left ventricular ejection fraction by 3D volume is 61 %. The left ventricle has normal function. The left ventricle has no regional wall motion abnormalities. There is mild left ventricular hypertrophy. Left ventricular diastolic parameters are indeterminate. 2. Right ventricular systolic function is moderately reduced. The right ventricular size is mildly enlarged. There is mildly elevated pulmonary artery systolic pressure. The estimated right ventricular systolic pressure is 37.4 mmHg. 3. Left atrial size was mildly dilated. 4. The mitral valve is degenerative. Mild mitral valve regurgitation. No evidence of mitral stenosis. 5. The aortic valve is tricuspid. Aortic valve regurgitation is not visualized. Aortic valve sclerosis is present, with no evidence of aortic valve stenosis. 6. The inferior vena cava is normal in size with <50% respiratory variability, suggesting right atrial pressure of 8 mmHg.  Comparison(s): A prior study was performed on 09/24/2023. Prior study reported normal RV size and function.  FINDINGS Left Ventricle: Left ventricular ejection fraction, by estimation, is 55 to 60%. Left ventricular ejection fraction  by 3D volume is 61 %. The left ventricle has normal function. The left ventricle has no regional wall motion abnormalities. The left ventricular internal cavity size was normal in size. There is mild left ventricular hypertrophy. Abnormal (paradoxical) septal motion consistent with post-operative status. Left ventricular diastolic parameters are indeterminate.  Right Ventricle: The right ventricular size is mildly enlarged. No increase in right ventricular wall thickness. Right ventricular systolic function is moderately reduced. There is mildly elevated pulmonary artery systolic pressure. The tricuspid regurgitant velocity is 2.71 m/s, and with an assumed right atrial pressure of 8 mmHg, the estimated right ventricular systolic pressure is 37.4 mmHg.  Left Atrium: Left atrial size was mildly dilated.  Right Atrium: Right atrial size was normal in size.  Pericardium: There is no evidence of pericardial effusion.  Mitral Valve: The mitral valve is degenerative in appearance. Mild mitral annular calcification. Mild mitral  valve regurgitation. No evidence of mitral valve stenosis.  Tricuspid Valve: The tricuspid valve is normal in structure. Tricuspid valve regurgitation is mild . No evidence of tricuspid stenosis.  Aortic Valve: The aortic valve is tricuspid. Aortic valve regurgitation is not visualized. Aortic valve sclerosis is present, with no evidence of aortic valve stenosis.  Pulmonic Valve: The pulmonic valve was not well visualized. Pulmonic valve regurgitation is not visualized. No evidence of pulmonic stenosis.  Aorta: The aortic root and ascending aorta are structurally normal, with no evidence of dilitation.  Venous: The inferior vena cava is normal in size with less than 50% respiratory variability, suggesting right atrial pressure of 8 mmHg.  IAS/Shunts: No atrial level shunt detected by color flow Doppler.  Additional Comments: 3D was performed not requiring image post  processing on an independent workstation and was normal.   LEFT VENTRICLE PLAX 2D LVIDd:         5.80 cm         Diastology LVIDs:         3.95 cm         LV e' medial:    6.85 cm/s LV PW:         1.10 cm         LV E/e' medial:  10.8 LV IVS:        1.25 cm         LV e' lateral:   9.25 cm/s LVOT diam:     2.30 cm         LV E/e' lateral: 8.0 LV SV:         71 LV SV Index:   32 LVOT Area:     4.15 cm        3D Volume EF LV 3D EF:    Left ventricul ar ejection fraction by 3D volume is 61 %.  3D Volume EF: 3D EF:        61 % LV EDV:       157 ml LV ESV:       60 ml LV SV:        96 ml  RIGHT VENTRICLE RV Basal diam:  4.90 cm RV Mid diam:    3.60 cm RV S prime:     6.58 cm/s RVSP:           32.4 mmHg  LEFT ATRIUM             Index        RIGHT ATRIUM           Index LA diam:        3.55 cm 1.60 cm/m   RA Pressure: 3.00 mmHg LA Vol (A2C):   86.9 ml 39.25 ml/m  RA Area:     16.90 cm LA Vol (A4C):   87.3 ml 39.46 ml/m  RA Volume:   55.70 ml  25.17 ml/m LA Biplane Vol: 74.3 ml 33.58 ml/m AORTIC VALVE LVOT Vmax:   78.25 cm/s LVOT Vmean:  49.250 cm/s LVOT VTI:    0.172 m  AORTA Ao Root diam: 3.60 cm Ao Asc diam:  3.40 cm  MITRAL VALVE               TRICUSPID VALVE MV Area (PHT): cm         TR Peak grad:   29.4 mmHg MV Decel Time: 220 msec    TR Vmax:        271.00 cm/s MV E velocity: 73.88 cm/s  Estimated RAP:  3.00 mmHg MV A velocity: 42.46 cm/s  RVSP:           32.4 mmHg MV E/A ratio:  1.74 SHUNTS Systemic VTI:  0.17 m Systemic Diam: 2.30 cm  Sunit Tolia Electronically signed by Madonna Large Signature Date/Time: 11/24/2023/5:32:43 PM    Final   TEE  ECHO INTRAOPERATIVE TEE 09/27/2023  Narrative *INTRAOPERATIVE TRANSESOPHAGEAL REPORT *    Patient Name:   Paul Dickerson Date of Exam: 09/27/2023 Medical Rec #:  [id number]         Height:       71.0 in Accession #:    [id number]        Weight:       230.0 lb Date of Birth:  June 03, 1960          BSA:          2.24 m Patient Age:    63 years          BP:           145/79 mmHg Patient Gender: M                 HR:           65 bpm. Exam Location:  Anesthesiology  Transesophogeal exam was perform intraoperatively during surgical procedure. Patient was closely monitored under general anesthesia during the entirety of examination.  Indications:     CAD Performing Phys: Debby Like MD Diagnosing Phys: Debby Like MD  Complications: No known complications during this procedure. POST-OP IMPRESSIONS _ Left Ventricle: The left ventricle is unchanged from pre-bypass. _ Right Ventricle: The right ventricle appears unchanged from pre-bypass. _ Aorta: The aorta appears unchanged from pre-bypass. _ Left Atrium: The left atrium appears unchanged from pre-bypass. _ Left Atrial Appendage: The left atrial appendage appears unchanged from pre-bypass. _ Aortic Valve: The aortic valve appears unchanged from pre-bypass. _ Mitral Valve: The mitral valve appears unchanged from pre-bypass. _ Tricuspid Valve: The tricuspid valve appears unchanged from pre-bypass. _ Pulmonic Valve: The pulmonic valve appears unchanged from pre-bypass. _ Interatrial Septum: The interatrial septum appears unchanged from pre-bypass. _ Interventricular Septum: The interventricular septum appears unchanged from pre-bypass. _ Pericardium: The pericardium appears unchanged from pre-bypass.  PRE-OP FINDINGS Left Ventricle: The left ventricle has mild-moderately reduced systolic function, with an ejection fraction of 40-45%. The cavity size was normal. Left ventrical global hypokinesis without regional wall motion abnormalities. There is no left ventricular hypertrophy.   Right Ventricle: The right ventricle has normal systolic function. The cavity was normal. There is no increase in right ventricular wall thickness.  Left Atrium: Left atrial size was dilated.  Right Atrium: Right atrial size was normal in  size.  Interatrial Septum: No atrial level shunt detected by color flow Doppler.  Pericardium: There is no evidence of pericardial effusion.  Mitral Valve: The mitral valve is normal in structure. Mitral valve regurgitation is trivial by color flow Doppler. There is No evidence of mitral stenosis.  Tricuspid Valve: The tricuspid valve was normal in structure. Tricuspid valve regurgitation was not visualized by color flow Doppler. No evidence of tricuspid stenosis is present.  Aortic Valve: The aortic valve is tricuspid Aortic valve regurgitation was not visualized by color flow Doppler. There is no stenosis of the aortic valve.   Pulmonic Valve: The pulmonic valve was not assessed. Pulmonic valve regurgitation was not assessed by color flow Doppler.   Aorta: The aortic root, ascending aorta and aortic arch are normal in  size and structure.   Debby Like MD Electronically signed by Debby Like MD Signature Date/Time: 09/28/2023/10:29:20 AM    Final    CT SCANS  CT CORONARY MORPH W/CTA COR W/SCORE 09/16/2023  Addendum 09/26/2023 10:45 AM ADDENDUM REPORT: 09/26/2023 10:43  EXAM: OVER-READ INTERPRETATION  CT CHEST  The following report is an over-read performed by radiologist Dr. Oneil Devonshire of [facility] on 09/26/2023. This over-read does not include interpretation of cardiac or coronary anatomy or pathology. The coronary calcium  score/coronary CTA interpretation by the cardiologist is attached.  COMPARISON:  None.  FINDINGS: Cardiovascular: Mild atherosclerotic calcifications are noted without aneurysmal dilatation.  Mediastinum/Nodes: There are no enlarged lymph nodes within the visualized mediastinum.  Lungs/Pleura: There is no pleural effusion. Minimal right basilar atelectasis is noted.  Upper abdomen: No significant findings in the visualized upper abdomen.  Musculoskeletal/Chest wall: No chest wall mass or suspicious osseous findings within  the visualized chest.  IMPRESSION: Aortic Atherosclerosis (ICD10-I70.0).   Electronically Signed By: Oneil Devonshire M.D. On: 09/26/2023 10:43  Narrative HISTORY: Heart failure, known or suspected, initial workup  EXAM: Cardiac/Coronary CT  TECHNIQUE: The patient was scanned on a [employer].  PROTOCOL: A 120 kV prospective scan was triggered in the descending thoracic aorta at 111 HU's. Axial non-contrast 3 mm slices were carried out through the heart. The data set was analyzed on a dedicated work station and scored using the Agatston method. Gantry rotation speed was 250 msecs and collimation was 0.6 mm. Heart rate was optimized medically and sl NTG was given. The 3D data set was reconstructed in 5% intervals of the 35-75 % of the R-R cycle. Systolic and diastolic phases were analyzed on a dedicated work station using MPR, MIP and VRT modes. The patient received 95mL OMNIPAQUE  IOHEXOL  350 MG/ML SOLN of contrast.  FINDINGS: Coronary calcium  score: The patient's coronary artery calcium  score is 2344, which places the patient in the 99th percentile.  Coronary arteries: Normal coronary origins.  Right dominance.  Right Coronary Artery: Normal caliber vessel, gives rise to PDA. Diffuse mixed calcified and noncalcified plaque. Proximal vessel with mixed plaque and 70-99% stenosis visually, though there is artifact in this location that limits further interpretation. There is a 6 mm segment in the mid RCA, after the second acute marginal, that does not have contrast uptake. There is distal contrast noted. Cannot determine if this is a CTO with distal filling collaterals vs high grade stenosis given image quality.  Left Main Coronary Artery: Normal caliber vessel. Diffuse mixed calcified and noncalcified plaque. Distal left main to ostial LAD with 70-99% stenosis  Left Anterior Descending Coronary Artery: Normal caliber vessel. Diffuse mixed calcified and  noncalcified plaque. Distal left main to ostial LAD with 70-99% stenosis. Two areas in proximal vessel with predominantly noncalcified plaque with 70-99% stenosis. Mid vessel with diffuse mixed plaque and focal 50-69% stenosis after D1. Distal vessel with focal 50-69% stenosis just distal to D2 and diffuse mixed plaque. Gives rise to two small diagonal branches.  Left Circumflex Artery: Normal caliber vessel. High risk plaque in proximal Lcx with appearance of protrusion of plaque into lumen and at least 50-69% . Gives rise to small first, large second OM branches. OM2 with scattered plaque and proximal 25-49% stenosis.  Aorta: Normal size, 31 mm at the mid ascending aorta (level of the PA bifurcation) measured double oblique. Aortic atherosclerosis. No dissection seen in visualized portions of the aorta.  Aortic Valve: No calcifications. Trileaflet.  Other findings:  Normal pulmonary vein drainage into the left atrium.  Normal left atrial appendage without a thrombus.  Normal size of the pulmonary artery.  Normal appearance of the pericardium.  IMPRESSION: 1. Severe obstructive CAD seen in all vessels, CADRADS = 4. While there is diffuse plaque/stenosis, highest risk is distal left main to ostial LAD with 70-99% stenosis, with two additional 70-99% focal stenoses in proximal LAD. High risk plaque in proximal Lcx with appearance of protrusion of plaque into lumen and at least 50-69% stenosis. RCA has some limitations due to artifact, but suggests 70-99% stenosis proximally and either CTO or high grade stenosis in mid vessel. CT FFR will be performed and reported separately, though due to artifact only left sided system will be able to be evaluated.  2. Coronary calcium  score of 2344. This was 99th percentile for age-, sex-, and race- matched controls.  3. Only left system plaque volume able to be evaluated. Plaque volume (left side) is 1513 mm3 (calcified plaque 369  mm3; noncalcified plaque 1117 mm3).  4. Normal coronary origin with right dominance.  High risk findings called to Dr. Santo. Snapshot images of lesions saved to PACS.  INTERPRETATION:  CAD-RADS 4: Severe stenosis. (70-99% or > 50% left main). Cardiac catheterization or CT FFR is recommended. Consider symptom-guided anti-ischemic pharmacotherapy as well as risk factor modification per guideline directed care. Invasive coronary angiography recommended with revascularization per published guideline statements.  Electronically Signed: By: Shelda Bruckner M.D. On: 09/17/2023 14:09     ______________________________________________________________________________________________      Physical Exam:    VS:  BP 138/72 (BP Location: Right Arm, Patient Position: Sitting, Cuff Size: Large)   Pulse 79   Resp 16   Ht 5' 11 (1.803 m)   Wt 237 lb (107.5 kg)   SpO2 97%   BMI 33.05 kg/m    Wt Readings from Last 3 Encounters:  02/08/24 237 lb (107.5 kg)  11/02/23 224 lb (101.6 kg)  10/14/23 232 lb 9.6 oz (105.5 kg)    Gen: no distress HEENT: No JVD, poor dentition Cardiac: No Rubs or Gallops, no murmur, RRR +2 radial pulses Respiratory: Clear to auscultation bilaterally, normal effort, normal  respiratory rate GI: Soft, nontender, non-distended  MS: No  edema;  moves all extremities Integument: Skin feels warm Neuro:  At time of evaluation, alert and oriented to person/place/time/situation  Psych: Normal affect, patient feels well   ASSESSMENT AND PLAN: .    Coronary artery disease, status post multivessel coronary artery bypass grafting Status post multivessel CABG performed on November 02, 2023, for multivessel coronary artery disease. Currently, no chest pain or dyspnea. Recovery is progressing well with no dizziness. The goal is to prevent further cardiac events. - Order repeat blood work to assess cholesterol levels and monitor cardiac health - Schedule  follow-up in six months  Lower extremity edema post-CABG HF- Recovered- EF, Mild MR and Mild TR Persistent lower extremity edema post-CABG, more pronounced in the right leg. Managed with hydrochlorothiazide  and Lasix . The scar from the saphenous vein graft harvest is healing well. - Order blood work to assess fluid levels and kidney function - Consider increasing Lasix  dose if fluid levels are elevated - Monitor potassium levels if Lasix  dose is adjusted  Hypertension Blood pressure is slightly above goal. Managed with hydrochlorothiazide  and Lasix . - Review blood work results to guide potential adjustments in therapy  Alcohol use, reduced but ongoing Alcohol consumption reduced to approximately two beers per day. - Encourage further reduction in alcohol  consumption  Longitudinal care: The evaluation and management services provided today reflect the complexity inherent in caring for this patient, including the ongoing longitudinal relationship and management of multiple chronic conditions and/or the need for care coordination. The visit required a comprehensive assessment and management plan tailored to the patient's unique needs Time was spent addressing not only the acute concerns but also the broader context of the patient's health, including preventive care, chronic disease management, and care coordination as appropriate.  Complex longitudinal is necessary for conditions including: aggressive secondary prevention now that he is s/p CABG   Six months with Jackee Stanly Leavens, MD FASE [facility] Cardiologist [facility]  [facility]  [address] [place], [address] [place], [place] [address] [phone]  5:28 PM

## 2024-02-28 ENCOUNTER — Other Ambulatory Visit (HOSPITAL_COMMUNITY): Payer: Self-pay

## 2024-03-24 ENCOUNTER — Other Ambulatory Visit: Payer: Self-pay | Admitting: Student

## 2024-03-24 ENCOUNTER — Other Ambulatory Visit (HOSPITAL_COMMUNITY): Payer: Self-pay

## 2024-03-26 ENCOUNTER — Other Ambulatory Visit: Payer: Self-pay | Admitting: Physician Assistant

## 2024-03-27 ENCOUNTER — Encounter (HOSPITAL_COMMUNITY): Payer: Self-pay

## 2024-03-27 ENCOUNTER — Other Ambulatory Visit: Payer: Self-pay | Admitting: Physician Assistant

## 2024-03-27 ENCOUNTER — Other Ambulatory Visit (HOSPITAL_COMMUNITY): Payer: Self-pay

## 2024-03-28 ENCOUNTER — Other Ambulatory Visit (HOSPITAL_COMMUNITY): Payer: Self-pay

## 2024-05-21 ENCOUNTER — Other Ambulatory Visit: Payer: Self-pay

## 2024-05-26 ENCOUNTER — Other Ambulatory Visit: Payer: Self-pay | Admitting: Family Medicine

## 2024-05-26 ENCOUNTER — Other Ambulatory Visit (HOSPITAL_COMMUNITY): Payer: Self-pay

## 2024-05-26 ENCOUNTER — Other Ambulatory Visit: Payer: Self-pay

## 2024-05-29 ENCOUNTER — Other Ambulatory Visit (HOSPITAL_COMMUNITY): Payer: Self-pay

## 2024-05-29 MED ORDER — ASPIRIN 81 MG PO TBEC
81.0000 mg | DELAYED_RELEASE_TABLET | Freq: Every day | ORAL | 2 refills | Status: AC
  Filled 2024-05-29: qty 100, 100d supply, fill #0
